# Patient Record
Sex: Female | Born: 1985 | Race: Black or African American | Hispanic: No | Marital: Single | Smoking: Former smoker
Health system: Southern US, Community
[De-identification: ages and names within clinical notes are randomized; demographics above are authoritative.]

## PROBLEM LIST (undated history)

## (undated) DIAGNOSIS — Z789 Other specified health status: Secondary | ICD-10-CM

## (undated) DIAGNOSIS — T7840XA Allergy, unspecified, initial encounter: Secondary | ICD-10-CM

## (undated) DIAGNOSIS — K219 Gastro-esophageal reflux disease without esophagitis: Secondary | ICD-10-CM

## (undated) HISTORY — DX: Gastro-esophageal reflux disease without esophagitis: K21.9

## (undated) HISTORY — PX: NO PAST SURGERIES: SHX2092

## (undated) HISTORY — PX: TUBAL LIGATION: SHX77

## (undated) HISTORY — DX: Allergy, unspecified, initial encounter: T78.40XA

---
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
## 2008-08-31 ENCOUNTER — Emergency Department: Payer: Self-pay | Admitting: Emergency Medicine

## 2009-09-07 ENCOUNTER — Emergency Department: Payer: Self-pay | Admitting: Emergency Medicine

## 2011-01-01 ENCOUNTER — Emergency Department: Payer: Self-pay | Admitting: Emergency Medicine

## 2011-03-08 ENCOUNTER — Emergency Department: Payer: Self-pay | Admitting: Internal Medicine

## 2011-12-26 ENCOUNTER — Emergency Department: Payer: Self-pay | Admitting: Internal Medicine

## 2011-12-26 LAB — URINALYSIS, COMPLETE
Glucose,UR: NEGATIVE mg/dL (ref 0–75)
Leukocyte Esterase: NEGATIVE
Nitrite: NEGATIVE
Protein: NEGATIVE
RBC,UR: 19 /HPF (ref 0–5)
Specific Gravity: 1.01 (ref 1.003–1.030)
Squamous Epithelial: 1

## 2011-12-26 LAB — COMPREHENSIVE METABOLIC PANEL
Albumin: 4 g/dL (ref 3.4–5.0)
Alkaline Phosphatase: 47 U/L — ABNORMAL LOW (ref 50–136)
BUN: 10 mg/dL (ref 7–18)
Bilirubin,Total: 0.4 mg/dL (ref 0.2–1.0)
Co2: 25 mmol/L (ref 21–32)
Creatinine: 0.93 mg/dL (ref 0.60–1.30)
SGOT(AST): 23 U/L (ref 15–37)
SGPT (ALT): 26 U/L
Sodium: 138 mmol/L (ref 136–145)
Total Protein: 8 g/dL (ref 6.4–8.2)

## 2011-12-26 LAB — CBC
MCH: 31.1 pg (ref 26.0–34.0)
MCHC: 33.1 g/dL (ref 32.0–36.0)
MCV: 94 fL (ref 80–100)
Platelet: 228 10*3/uL (ref 150–440)
RBC: 4.04 10*6/uL (ref 3.80–5.20)
RDW: 13.6 % (ref 11.5–14.5)

## 2011-12-26 LAB — HCG, QUANTITATIVE, PREGNANCY: Beta Hcg, Quant.: 8675 m[IU]/mL — ABNORMAL HIGH

## 2011-12-26 LAB — LIPASE, BLOOD: Lipase: 61 U/L — ABNORMAL LOW (ref 73–393)

## 2012-01-02 ENCOUNTER — Emergency Department: Payer: Self-pay | Admitting: Emergency Medicine

## 2012-01-02 LAB — COMPREHENSIVE METABOLIC PANEL
Albumin: 3.9 g/dL (ref 3.4–5.0)
Alkaline Phosphatase: 49 U/L — ABNORMAL LOW (ref 50–136)
Anion Gap: 9 (ref 7–16)
BUN: 11 mg/dL (ref 7–18)
Bilirubin,Total: 0.7 mg/dL (ref 0.2–1.0)
Glucose: 91 mg/dL (ref 65–99)
Potassium: 3.3 mmol/L — ABNORMAL LOW (ref 3.5–5.1)
SGOT(AST): 22 U/L (ref 15–37)
Sodium: 136 mmol/L (ref 136–145)
Total Protein: 7.9 g/dL (ref 6.4–8.2)

## 2012-01-02 LAB — URINALYSIS, COMPLETE
Bacteria: NONE SEEN
Bilirubin,UR: NEGATIVE
Blood: NEGATIVE
Glucose,UR: NEGATIVE mg/dL (ref 0–75)
Leukocyte Esterase: NEGATIVE
Ph: 6 (ref 4.5–8.0)
Specific Gravity: 1.024 (ref 1.003–1.030)
Squamous Epithelial: 3

## 2012-01-02 LAB — CBC
HGB: 13.2 g/dL (ref 12.0–16.0)
MCH: 31.6 pg (ref 26.0–34.0)
MCHC: 33.7 g/dL (ref 32.0–36.0)
Platelet: 238 10*3/uL (ref 150–440)
RBC: 4.18 10*6/uL (ref 3.80–5.20)
RDW: 13.9 % (ref 11.5–14.5)

## 2012-01-02 LAB — HCG, QUANTITATIVE, PREGNANCY: Beta Hcg, Quant.: 25926 m[IU]/mL — ABNORMAL HIGH

## 2012-01-15 ENCOUNTER — Emergency Department: Payer: Self-pay | Admitting: Emergency Medicine

## 2012-01-15 LAB — URINALYSIS, COMPLETE
Glucose,UR: NEGATIVE mg/dL (ref 0–75)
Leukocyte Esterase: NEGATIVE
Protein: 30
RBC,UR: 4 /HPF (ref 0–5)
Specific Gravity: 1.029 (ref 1.003–1.030)
Squamous Epithelial: 7
WBC UR: 2 /HPF (ref 0–5)

## 2012-03-14 ENCOUNTER — Emergency Department: Payer: Self-pay | Admitting: Emergency Medicine

## 2012-03-14 LAB — COMPREHENSIVE METABOLIC PANEL
Albumin: 3.5 g/dL (ref 3.4–5.0)
Alkaline Phosphatase: 45 U/L — ABNORMAL LOW (ref 50–136)
Anion Gap: 8 (ref 7–16)
BUN: 10 mg/dL (ref 7–18)
Bilirubin,Total: 0.3 mg/dL (ref 0.2–1.0)
Creatinine: 0.55 mg/dL — ABNORMAL LOW (ref 0.60–1.30)
EGFR (Non-African Amer.): >60
Potassium: 3.6 mmol/L (ref 3.5–5.1)
SGOT(AST): 28 U/L (ref 15–37)
SGPT (ALT): 19 U/L
Sodium: 137 mmol/L (ref 136–145)
Total Protein: 7.7 g/dL (ref 6.4–8.2)

## 2012-03-14 LAB — CBC
HCT: 34.8 % — ABNORMAL LOW (ref 35.0–47.0)
HGB: 11.6 g/dL — ABNORMAL LOW (ref 12.0–16.0)
MCV: 95 fL (ref 80–100)
Platelet: 254 10*3/uL (ref 150–440)
RDW: 13.3 % (ref 11.5–14.5)
WBC: 8.4 10*3/uL (ref 3.6–11.0)

## 2012-03-14 LAB — URINALYSIS, COMPLETE
Bilirubin,UR: NEGATIVE
Blood: NEGATIVE
Glucose,UR: NEGATIVE mg/dL (ref 0–75)
Ketone: NEGATIVE
Nitrite: NEGATIVE
Ph: 7 (ref 4.5–8.0)
RBC,UR: 1 /HPF (ref 0–5)
Squamous Epithelial: 5
WBC UR: 2 /HPF (ref 0–5)

## 2012-03-14 LAB — LIPASE, BLOOD: Lipase: 65 U/L — ABNORMAL LOW (ref 73–393)

## 2012-03-14 LAB — HCG, QUANTITATIVE, PREGNANCY: Beta Hcg, Quant.: 17583 m[IU]/mL — ABNORMAL HIGH

## 2012-05-12 ENCOUNTER — Observation Stay: Payer: Self-pay

## 2012-05-12 LAB — URINALYSIS, COMPLETE
Bilirubin,UR: NEGATIVE
Ketone: NEGATIVE
Ph: 7 (ref 4.5–8.0)
RBC,UR: 4 /HPF (ref 0–5)
Specific Gravity: 1.014 (ref 1.003–1.030)
Squamous Epithelial: 1
WBC UR: 1 /HPF (ref 0–5)

## 2012-05-14 LAB — URINE CULTURE

## 2012-06-10 ENCOUNTER — Observation Stay: Payer: Self-pay | Admitting: Obstetrics & Gynecology

## 2012-06-10 LAB — URINALYSIS, COMPLETE
Bacteria: NONE SEEN
Bilirubin,UR: NEGATIVE
Blood: NEGATIVE
Leukocyte Esterase: NEGATIVE
Nitrite: NEGATIVE
Ph: 6 (ref 4.5–8.0)
Protein: NEGATIVE
Specific Gravity: 1.013 (ref 1.003–1.030)
Squamous Epithelial: 1

## 2012-07-30 ENCOUNTER — Observation Stay: Payer: Self-pay

## 2012-08-14 ENCOUNTER — Observation Stay: Payer: Self-pay | Admitting: Obstetrics and Gynecology

## 2012-08-19 ENCOUNTER — Observation Stay: Payer: Self-pay | Admitting: Obstetrics & Gynecology

## 2012-08-20 ENCOUNTER — Inpatient Hospital Stay: Payer: Self-pay

## 2012-08-20 LAB — CBC WITH DIFFERENTIAL/PLATELET
Eosinophil #: 0.1 10*3/uL (ref 0.0–0.7)
HCT: 37 % (ref 35.0–47.0)
MCH: 30.7 pg (ref 26.0–34.0)
MCHC: 33.7 g/dL (ref 32.0–36.0)
Monocyte #: 1.5 x10 3/mm — ABNORMAL HIGH (ref 0.2–0.9)
Monocyte %: 14.5 %
Neutrophil %: 67.8 %
Platelet: 232 10*3/uL (ref 150–440)

## 2012-08-21 LAB — HEMATOCRIT: HCT: 34 % — ABNORMAL LOW (ref 35.0–47.0)

## 2013-01-04 IMAGING — CT CT CERVICAL SPINE WITHOUT CONTRAST
1 series · 12 of 14 positions shown, 15 images · non-contrast
Comparison: none

REASON FOR EXAM: pain, multiple trauma
COMMENTS:

PROCEDURE:     CT  - CT CERVICAL SPINE WO  - January 01, 2011 [DATE]
RESULT:     History: Pain.

[Series 6: axial · axial · 0.29mm/px · z∈[+876,+1008]mm · 12 of 79 slices shown, 15 images]
[im 7/79  soft-tissue]
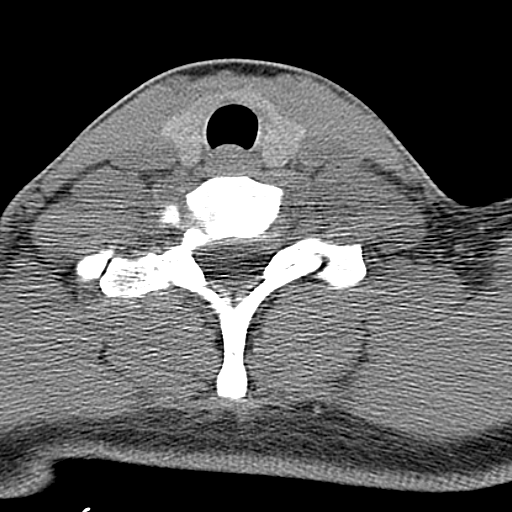
[im 7/79  bone]
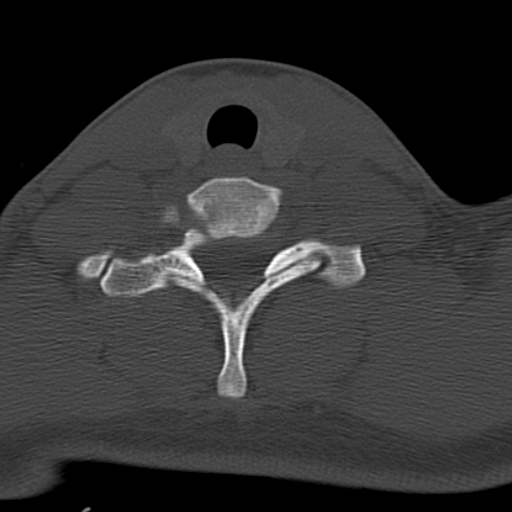
[im 13/79  bone]
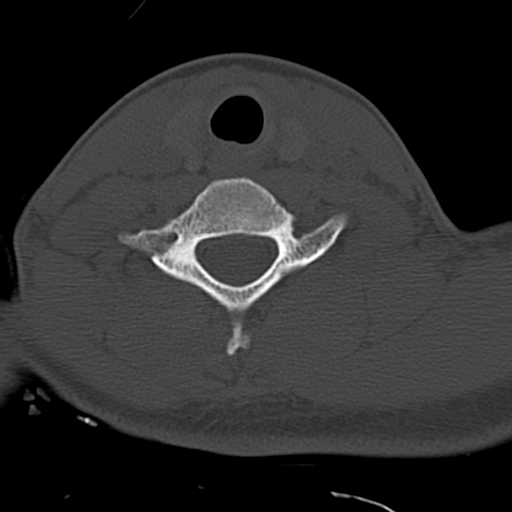
[im 19/79  bone]
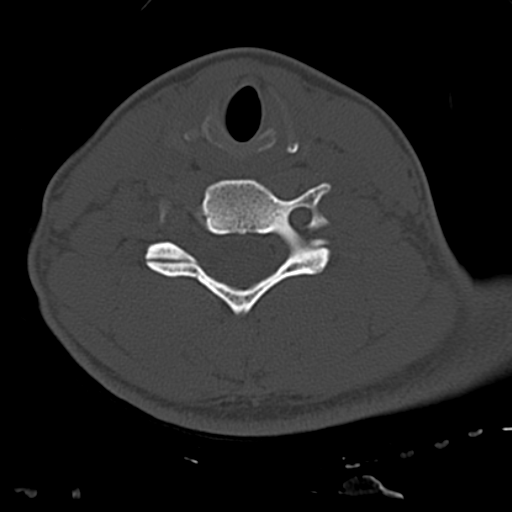
[im 25/79  bone]
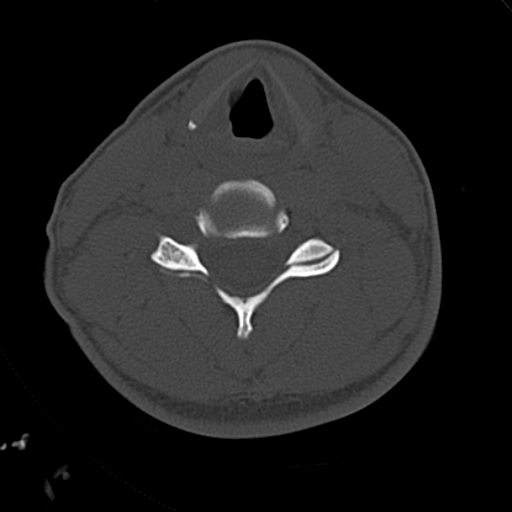
[im 31/79  soft-tissue]
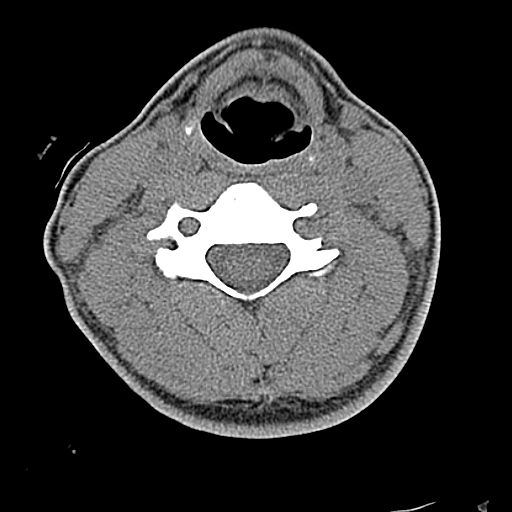
[im 31/79  bone]
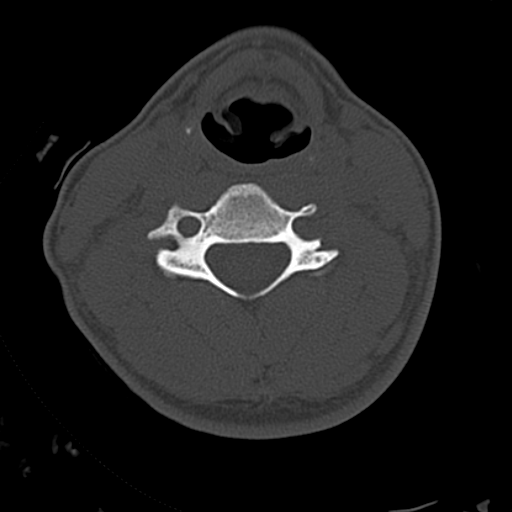
[im 37/79  bone]
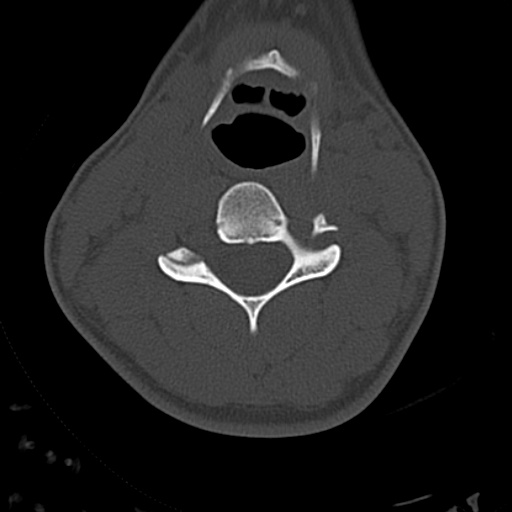
[im 43/79  bone]
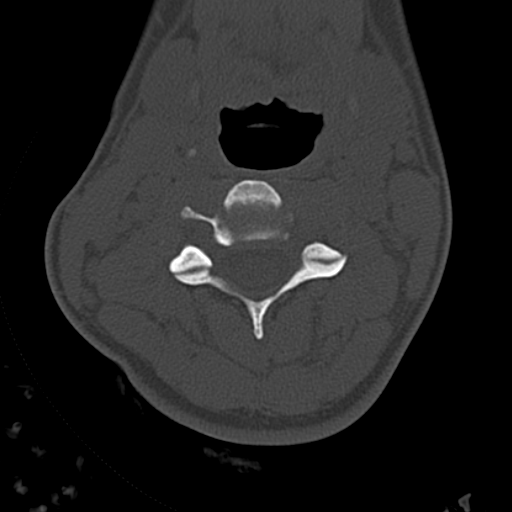
[im 49/79  bone]
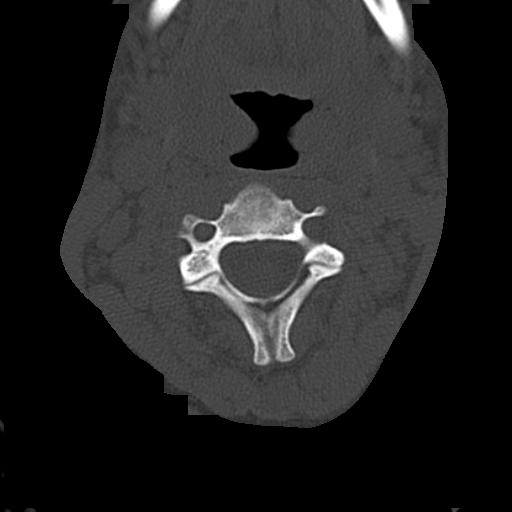
[im 55/79  soft-tissue]
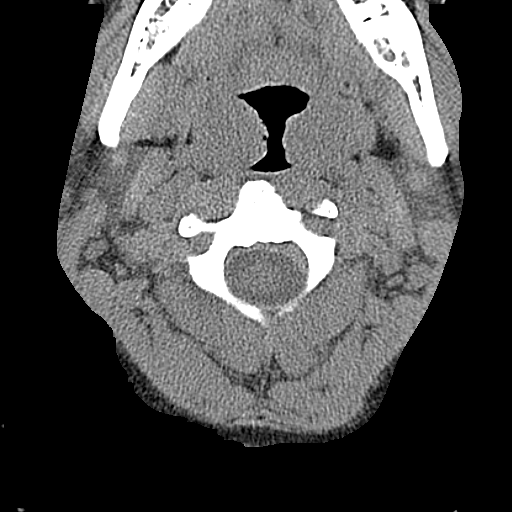
[im 55/79  bone]
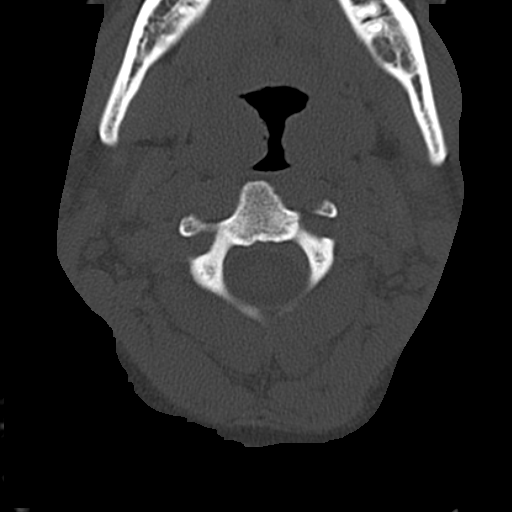
[im 61/79  bone]
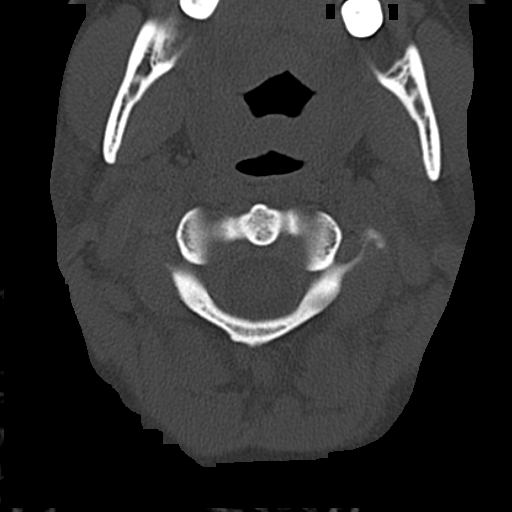
[im 67/79  bone]
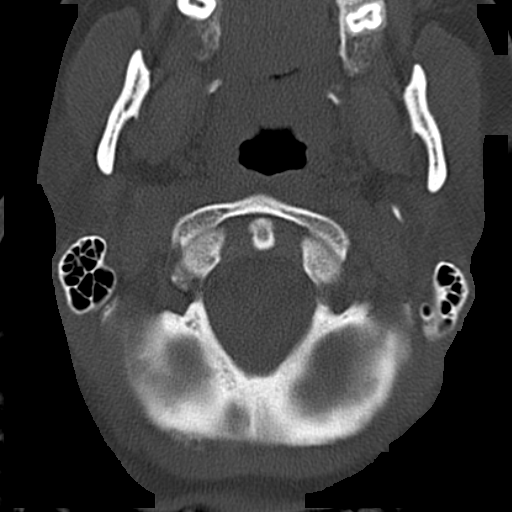
[im 73/79  bone]
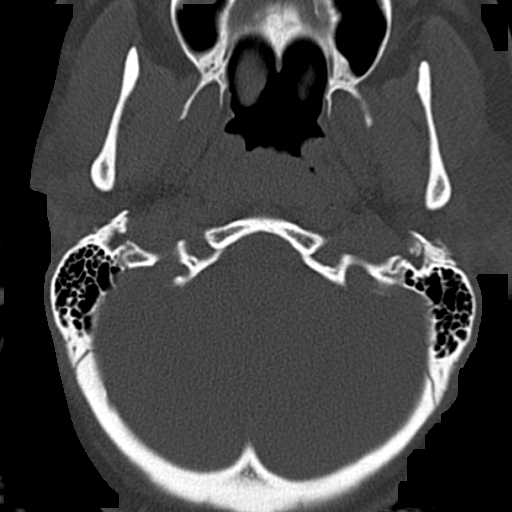

[12 of 14 positions shown; findings below may reference images not displayed]

FINDINGS: Standard CT obtained. No acute bony abnormality. No evidence of
fracture. Multiple cervical lymph nodes are noted. Clinical correlation
suggested.
IMPRESSION: No acute bony abnormality. No is a fracture-dislocation.
2. Multiple cervical lymph nodes were clinical correlation suggested.

## 2013-05-11 ENCOUNTER — Emergency Department: Payer: Self-pay | Admitting: Internal Medicine

## 2013-12-29 IMAGING — US US OB < 14 WEEKS - US OB TV
1 series · 17 of 28 positions shown · non-contrast
Comparison: none

REASON FOR EXAM: vag bl
COMMENTS:

[Series 1: us ob < 14 weeks - us ob tv · 17 of 158 slices shown]
[im 1/158]
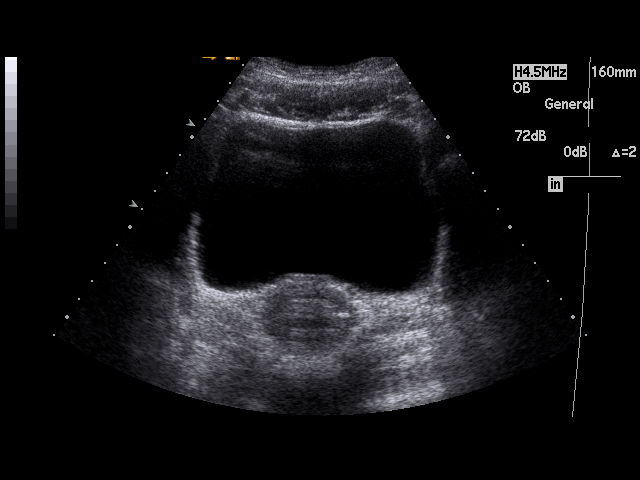
[im 12/158]
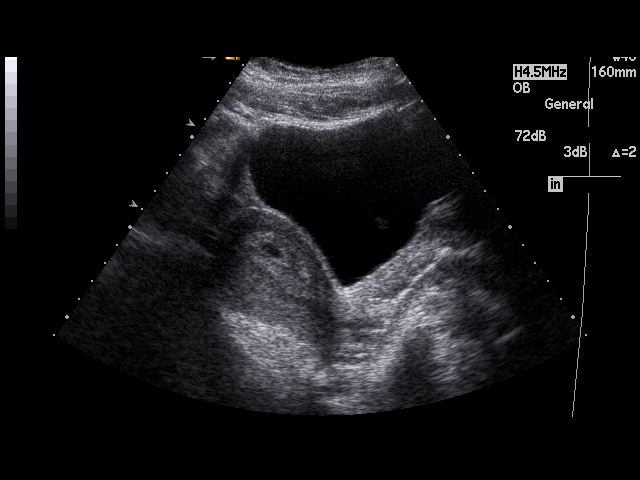
[im 24/158]
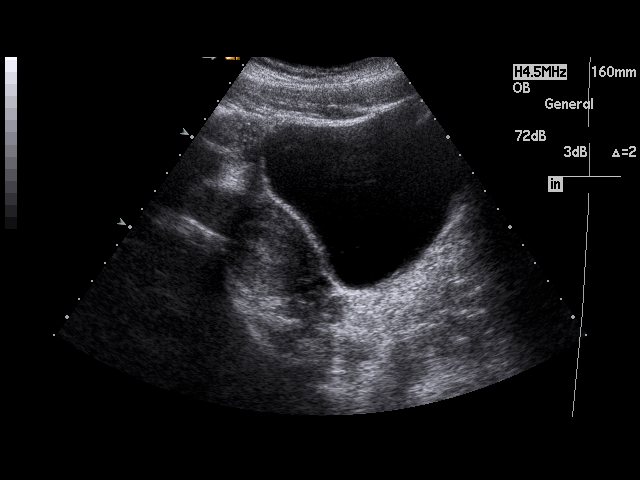
[im 30/158]
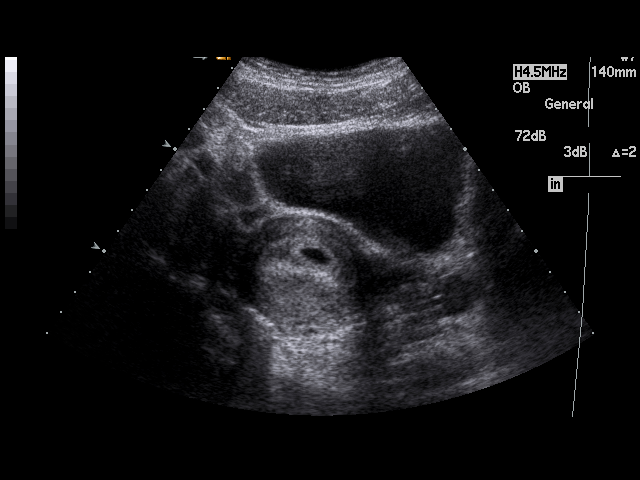
[im 41/158]
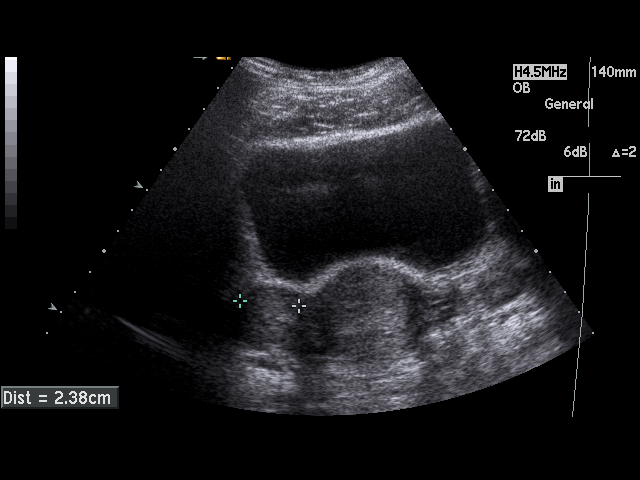
[im 53/158]
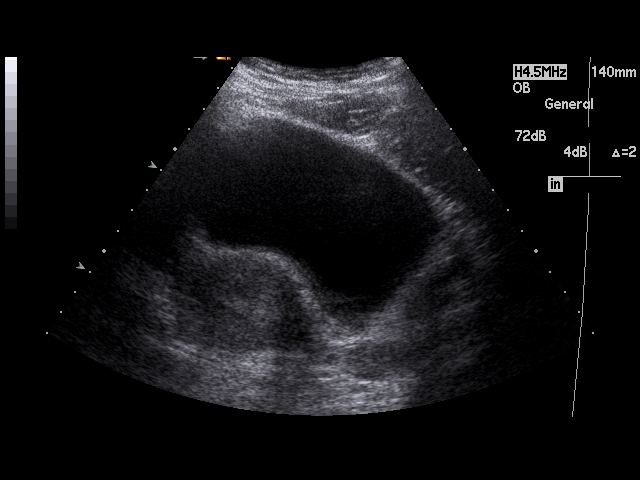
[im 59/158]
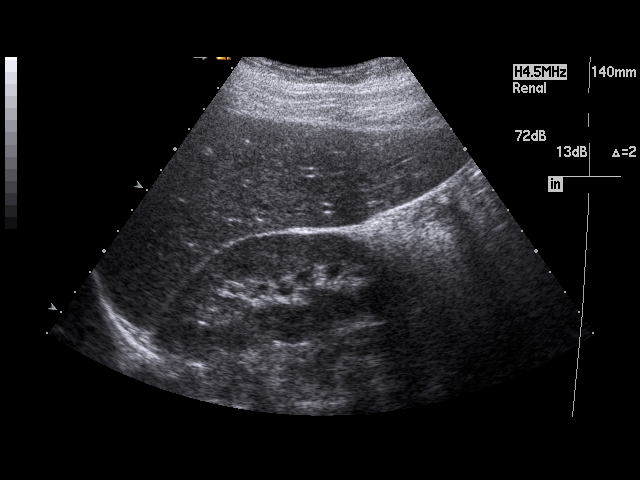
[im 70/158]
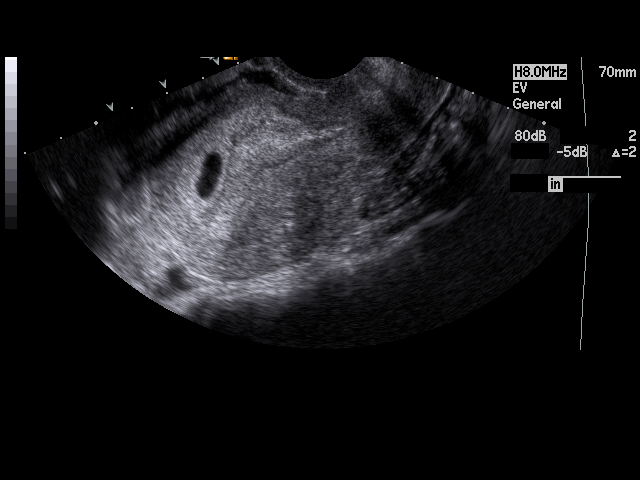
[im 82/158]
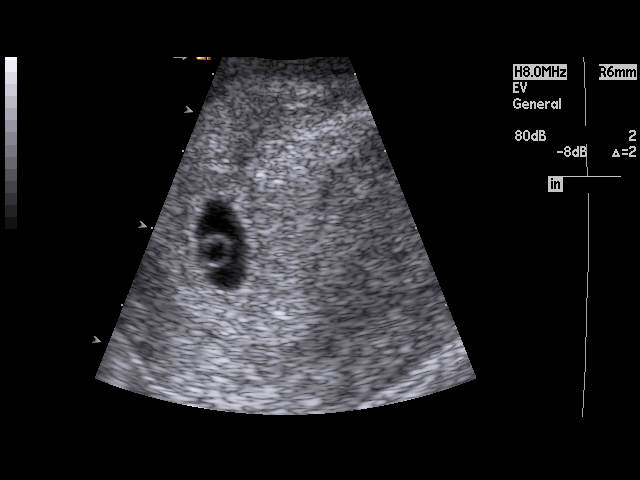
[im 88/158]
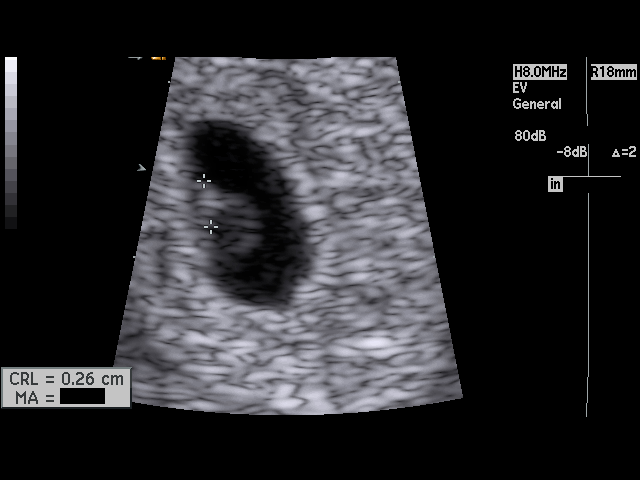
[im 99/158]
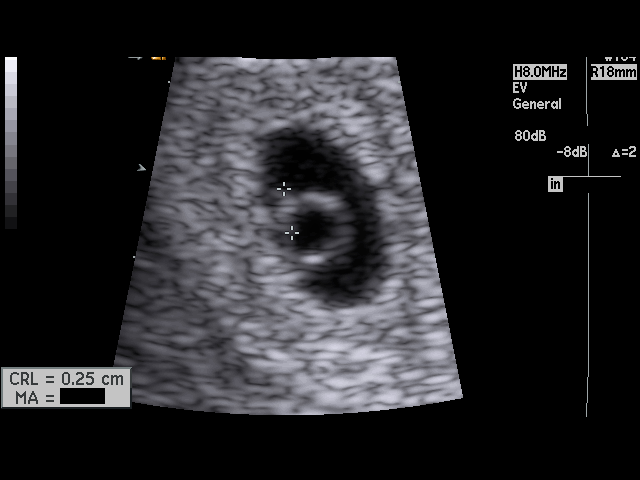
[im 105/158]
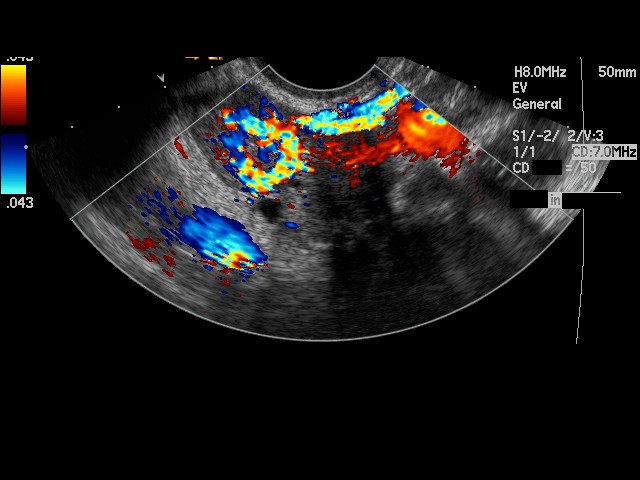
[im 117/158]
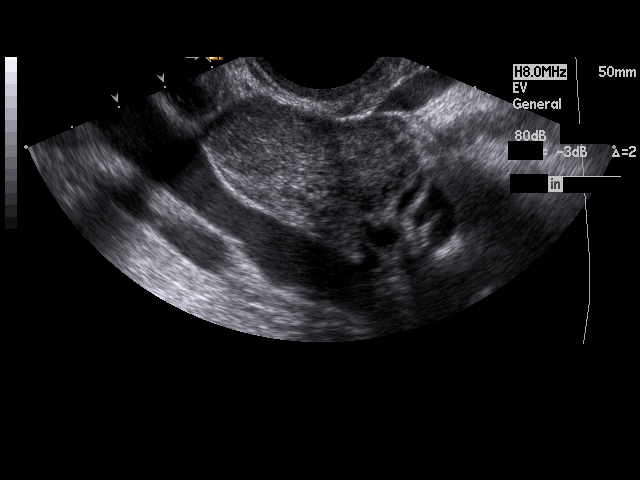
[im 128/158]
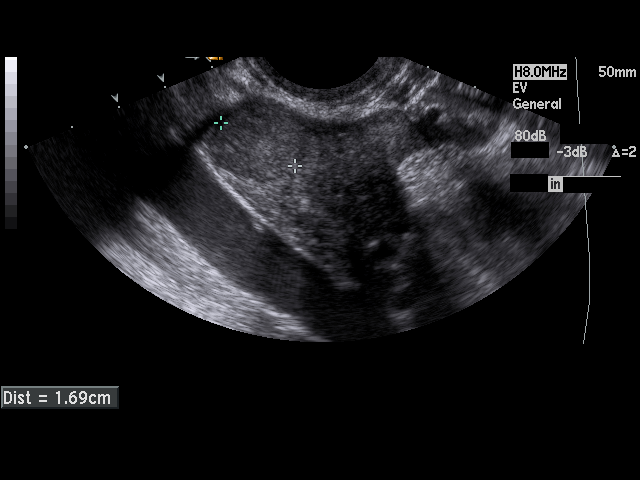
[im 134/158]
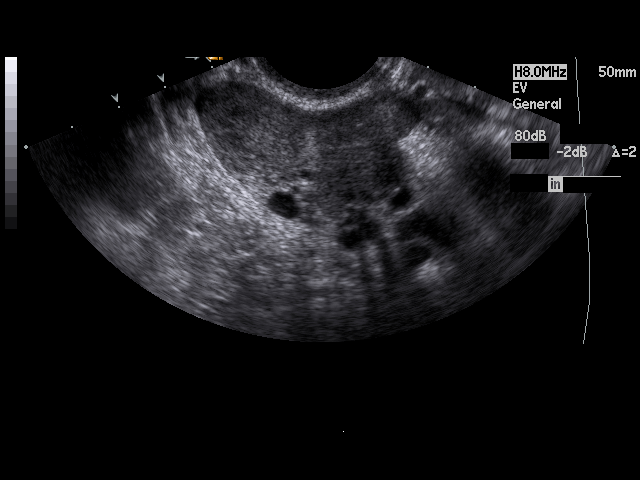
[im 146/158]
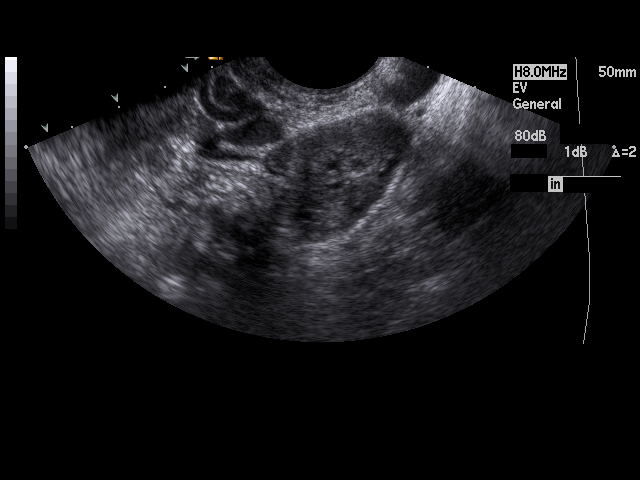
[im 158/158]
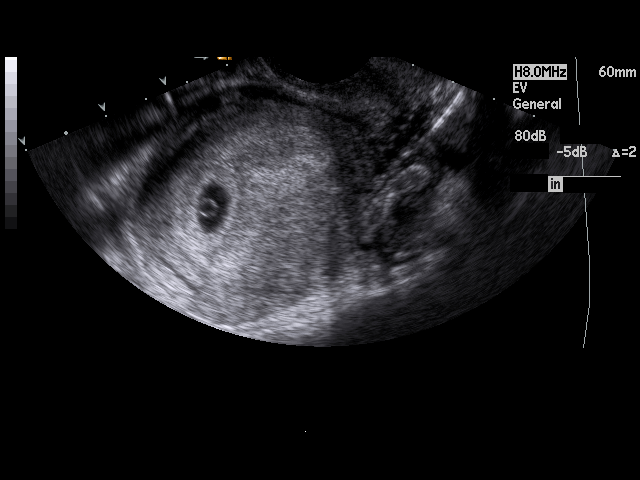

[17 of 28 positions shown; findings below may reference images not displayed]

PROCEDURE:     US  - US OB LESS THAN 14 WEEKS/W TRANS  - December 26, 2011  [DATE]

RESULT:     The uterus contains a gestational sac containing a fetal pole
with crown-rump length of 0.26 cm corresponding to a 5 week 6 day gestation.
A yolk sac is demonstrated as well. A fetal cardiac rate of between 120-133
beats per minute is noted. The maternal right ovary measures 2.6 x 1.9 x
cm. It exhibits heterogeneous echotexture. It also appears mildly
hypervascular. The left ovary measures 2.4 x 1.9 x 1.7 cm and exhibits
developing follicles.
IMPRESSION: 1. There is a gravid uterus with an estimated gestational age of the fetus
of 5 weeks 6 days + / - 3 days. The estimated date of confinement is 21 August, 2012.
2. The right ovary is complex in appearance and somewhat hypervascular. The
appearance is nonspecific however. This will merit followup.

## 2014-05-10 ENCOUNTER — Emergency Department: Payer: Self-pay | Admitting: Emergency Medicine

## 2015-03-22 NOTE — H&P (Signed)
L&D Evaluation:  History:   HPI 29 year old G1P0 presents to L&D at 40 weeks 5 days with c/o contractions. EDD 08/14/12, [facility]. [facility], no significant events during pregnancy while [facility].  GBS Negative Pt set up for IOL in 2 days. Good FM, denies VB, LOF    Presents with contractions    Patient's Medical History No Chronic Illness    Patient's Surgical History none    Medications Pre Natal Vitamins    Allergies NKDA    Social History none    Family History Non-Contributory   Exam:   Vital Signs stable    Urine Protein not completed    General no apparent distress    Estimated Fetal Weight Average for gestational age    Back no CVAT    Edema no edema    Pelvic 2-3 cm per RN on 2 different checks    Mebranes Intact    FHT normal rate with no decels    Ucx irregular, mainly irritability, ctx's about every 15-20 mins   Impression:   Impression reactive NST, contractions 40+ weeks   Plan:   Plan EFM/NST, monitor contractions and for cervical change, discharge, increase PO fluids    Comments will allow pt to go home since no change in cervix and ctx's spaced out to 15-20 mins. Informed pt that appears she is having more irritability and needs to drink more water.  Pt visibally upset about no cervical change and being sent home. Reassurred pt that everything looks good here and ctx's are spaced out, cervix not changing, IOL set up for 2 days from now.  Will give her every opportunity to go into labor on her own. Offered pain meds/sleeping meds but pt declined.   Electronic Signatures: Shella Maximutnam, Courvoisier Hamblen (CNM)  (Signed 08-Oct-13 11:40)  Authored: L&D Evaluation   Last Updated: 08-Oct-13 11:40 by Shella MaximPutnam, Jahdiel Krol (CNM)

## 2015-03-22 NOTE — H&P (Signed)
L&D Evaluation:  History Expanded:   HPI 29 yo G1P0 at [redacted] weeks EGA w suprapubic pain, no new back pain (always has had muscle pain this pregnancy), vb, rom, contractions, DFM, n/v/d/c, f/c. [facility].    Presents with abdominal pain    Patient's Medical History No Chronic Illness    Patient's Surgical History none    Medications Pre Natal Vitamins    Allergies NKDA    Social History none    Family History Non-Contributory   ROS:   ROS All systems were reviewed.  HEENT, CNS, GI, GU, Respiratory, CV, Renal and Musculoskeletal systems were found to be normal.   Exam:   Vital Signs stable    General no apparent distress    Mental Status clear    Chest clear    Heart normal sinus rhythm    Abdomen gravid, non-tender    Estimated Fetal Weight Average for gestational age    Back no CVAT    Edema no edema    FHT normal rate with no decels    Ucx absent    Skin dry   Impression:   Impression Abd Pain.  Eval for UTI.  No other obvious etiology.  No signs or symptoms of Preterm Labor   Plan:   Plan UA, EFM/NST, fluids   Electronic Signatures: Letitia LibraHarris, Jalen Oberry Paul (MD)  (Signed 30-Jul-13 14:57)  Authored: L&D Evaluation   Last Updated: 30-Jul-13 14:57 by Letitia LibraHarris, Sabatino Williard Paul (MD)

## 2015-03-22 NOTE — H&P (Signed)
L&D Evaluation:  History Expanded:   HPI 29 yo G1 whose EDC 1 weeks ago, presents in early labor    Blood Type (Maternal) O positive    Group B Strep Results Maternal (Result >5wks must be treated as unknown) negative    Presents with abdominal pain    Patient's Medical History No Chronic Illness    Patient's Surgical History none    Medications Pre Natal Vitamins    Allergies NKDA    Social History none   Exam:   Vital Signs stable    General no apparent distress    Chest clear    Heart normal sinus rhythm    Abdomen gravid, non-tender    Estimated Fetal Weight Average for gestational age    Pelvic 4 cm, AROM clear fluid    Mebranes Ruptured    Description clear    FHT normal rate with no decels   Impression:   Impression early labor   Electronic Signatures: Towana Badgerosenow, Niti Leisure J (MD)  (Signed 09-Oct-13 10:01)  Authored: L&D Evaluation   Last Updated: 09-Oct-13 10:01 by Towana Badgerosenow, Laureano Hetzer J (MD)

## 2016-12-12 ENCOUNTER — Ambulatory Visit: Payer: Self-pay | Attending: Oncology

## 2016-12-12 ENCOUNTER — Encounter: Payer: Self-pay | Admitting: Obstetrics and Gynecology

## 2016-12-12 ENCOUNTER — Encounter (INDEPENDENT_AMBULATORY_CARE_PROVIDER_SITE_OTHER): Payer: Self-pay

## 2016-12-12 ENCOUNTER — Other Ambulatory Visit: Payer: Self-pay | Admitting: Obstetrics and Gynecology

## 2016-12-12 ENCOUNTER — Inpatient Hospital Stay: Payer: Self-pay | Attending: Obstetrics and Gynecology | Admitting: Obstetrics and Gynecology

## 2016-12-12 ENCOUNTER — Other Ambulatory Visit: Payer: Self-pay | Admitting: *Deleted

## 2016-12-12 VITALS — BP 113/76 | HR 79 | Temp 97.5°F | Ht 70.87 in | Wt 202.2 lb

## 2016-12-12 DIAGNOSIS — R87619 Unspecified abnormal cytological findings in specimens from cervix uteri: Secondary | ICD-10-CM | POA: Insufficient documentation

## 2016-12-12 DIAGNOSIS — B9689 Other specified bacterial agents as the cause of diseases classified elsewhere: Secondary | ICD-10-CM | POA: Insufficient documentation

## 2016-12-12 DIAGNOSIS — F17210 Nicotine dependence, cigarettes, uncomplicated: Secondary | ICD-10-CM | POA: Insufficient documentation

## 2016-12-12 DIAGNOSIS — R1030 Lower abdominal pain, unspecified: Secondary | ICD-10-CM

## 2016-12-12 DIAGNOSIS — N760 Acute vaginitis: Secondary | ICD-10-CM

## 2016-12-12 DIAGNOSIS — N63 Unspecified lump in unspecified breast: Secondary | ICD-10-CM

## 2016-12-12 DIAGNOSIS — E669 Obesity, unspecified: Secondary | ICD-10-CM | POA: Insufficient documentation

## 2016-12-12 LAB — PREGNANCY, URINE: Preg Test, Ur: NEGATIVE

## 2016-12-12 MED ORDER — METRONIDAZOLE 500 MG PO TABS: 500.0000 mg | ORAL_TABLET | Freq: Two times a day (BID) | ORAL | 0 refills | Status: AC

## 2016-12-12 NOTE — Patient Instructions (Signed)
Metronidazole extended-release tablets What is this medicine? METRONIDAZOLE (me troe NI da zole) is an antiinfective. It is used to treat certain kinds of bacterial and protozoal infections. It will not work for colds, flu, or other viral infections. This medicine may be used for other purposes; ask your health care provider or pharmacist if you have questions. COMMON BRAND NAME(S): Flagyl ER What should I tell my health care provider before I take this medicine? They need to know if you have any of these conditions: -anemia or other blood disorders -disease of the nervous system -fungal or yeast infection -if you drink alcohol containing drinks -liver disease -seizures -an unusual or allergic reaction to metronidazole, or other medicines, foods, dyes, or preservatives -pregnant or trying to get pregnant -breast-feeding How should I use this medicine? Take this medicine by mouth with a full glass of water. Follow the directions on the prescription label. Do not crush or chew. Take this medicine on an empty stomach 1 hour before or 2 hours after meals or food. Take your medicine at regular intervals. Do not take your medicine more often than directed. Take all of your medicine as directed even if you think you are better. Do not skip doses or stop your medicine early. Talk to your pediatrician regarding the use of this medicine in children. Special care may be needed. Overdosage: If you think you have taken too much of this medicine contact a poison control center or emergency room at once. NOTE: This medicine is only for you. Do not share this medicine with others. What if I miss a dose? If you miss a dose, take it as soon as you can. If it is almost time for your next dose, take only that dose. Do not take double or extra doses. What may interact with this medicine? Do not take this medicine with any of the following medications: -alcohol or any product that contains alcohol -amprenavir  oral solution -cisapride -disulfiram -dofetilide -dronedarone -paclitaxel injection -pimozide -ritonavir oral solution -sertraline oral solution -sulfamethoxazole-trimethoprim injection -thioridazine -ziprasidone This medicine may also interact with the following medications: -birth control pills -cimetidine -lithium -other medicines that prolong the QT interval (cause an abnormal heart rhythm) -phenobarbital -phenytoin -warfarin This list may not describe all possible interactions. Give your health care provider a list of all the medicines, herbs, non-prescription drugs, or dietary supplements you use. Also tell them if you smoke, drink alcohol, or use illegal drugs. Some items may interact with your medicine. What should I watch for while using this medicine? Tell your doctor or health care professional if your symptoms do not improve or if they get worse. You may get drowsy or dizzy. Do not drive, use machinery, or do anything that needs mental alertness until you know how this medicine affects you. Do not stand or sit up quickly, especially if you are an older patient. This reduces the risk of dizzy or fainting spells. Avoid alcoholic drinks while you are taking this medicine and for three days afterward. Alcohol may make you feel dizzy, sick, or flushed. If you are being treated for a sexually transmitted disease, avoid sexual contact until you have finished your treatment. Your sexual partner may also need treatment. What side effects may I notice from receiving this medicine? Side effects that you should report to your doctor or health care professional as soon as possible: -allergic reactions like skin rash, itching or hives, swelling of the face, lips, or tongue -confusion, clumsiness -difficulty speaking -discolored or sore  mouth -dizziness -fever, infection -numbness, tingling, pain or weakness in the hands or feet -trouble passing urine or change in the amount of  urine -redness, blistering, peeling or loosening of the skin, including inside the mouth -seizures -unusually weak or tired -vaginal irritation, dryness, or discharge Side effects that usually do not require medical attention (report to your doctor or health care professional if they continue or are bothersome): -diarrhea -headache -irritability -metallic taste -nausea -stomach pain or cramps -trouble sleeping This list may not describe all possible side effects. Call your doctor for medical advice about side effects. You may report side effects to FDA at 1-800-FDA-1088. Where should I keep my medicine? Keep out of the reach of children. Store at room temperature between 15 and 30 degrees C (59 and 86 degrees F). Protect from light and moisture. Keep container tightly closed. Throw away any unused medicine after the expiration date. NOTE: This sheet is a summary. It may not cover all possible information. If you have questions about this medicine, talk to your doctor, pharmacist, or health care provider.  2017 Elsevier/Gold Standard (2013-06-05 14:05:10) Bacterial Vaginosis Bacterial vaginosis is an infection of the vagina. It happens when too many germs (bacteria) grow in the vagina. This infection puts you at risk for infections from sex (STIs). Treating this infection can lower your risk for some STIs. You should also treat this if you are pregnant. It can cause your baby to be born early. Follow these instructions at home: Medicines  Take over-the-counter and prescription medicines only as told by your doctor.  Take or use your antibiotic medicine as told by your doctor. Do not stop taking or using it even if you start to feel better. General instructions  If you your sexual partner is a woman, tell her that you have this infection. She needs to get treatment if she has symptoms. If you have a female partner, he does not need to be treated.  During treatment:  Avoid sex.  Do not  douche.  Avoid alcohol as told.  Avoid breastfeeding as told.  Drink enough fluid to keep your pee (urine) clear or pale yellow.  Keep your vagina and butt (rectum) clean.  Wash the area with warm water every day.  Wipe from front to back after you use the toilet.  Keep all follow-up visits as told by your doctor. This is important. Preventing this condition  Do not douche.  Use only warm water to wash around your vagina.  Use protection when you have sex. This includes:  Latex condoms.  Dental dams.  Limit how many people you have sex with. It is best to only have sex with the same person (be monogamous).  Get tested for STIs. Have your partner get tested.  Wear underwear that is cotton or lined with cotton.  Avoid tight pants and pantyhose. This is most important in summer.  Do not use any products that have nicotine or tobacco in them. These include cigarettes and e-cigarettes. If you need help quitting, ask your doctor.  Do not use illegal drugs.  Limit how much alcohol you drink. Contact a doctor if:  Your symptoms do not get better, even after you are treated.  You have more discharge or pain when you pee (urinate).  You have a fever.  You have pain in your belly (abdomen).  You have pain with sex.  Your bleed from your vagina between periods. Summary  This infection happens when too many germs (bacteria) grow in  the vagina.  Treating this condition can lower your risk for some infections from sex (STIs).  You should also treat this if you are pregnant. It can cause early (premature) birth.  Do not stop taking or using your antibiotic medicine even if you start to feel better. This information is not intended to replace advice given to you by your health care provider. Make sure you discuss any questions you have with your health care provider. Document Released: 08/07/2008 Document Revised: 07/14/2016 Document Reviewed: 07/14/2016 Elsevier  Interactive Patient Education  2017 [employer].

## 2016-12-12 NOTE — Progress Notes (Signed)
Subjective:     Patient ID: Christine Mclaughlin, female   DOB: 03/26/1986, 31 y.o.   MRN: [id number]  HPI   Review of Systems     Objective:   Physical Exam  Pulmonary/Chest: Right breast exhibits no inverted nipple, no mass, no nipple discharge, no skin change and no tenderness. Left breast exhibits mass. Left breast exhibits no inverted nipple, no nipple discharge, no skin change and no tenderness. Breasts are asymmetrical.    Left breast greater than right       Assessment:     31 year old referred [facility] for left breast mass x 2 months, and abnormal pap with Atypical Glandular Cells of Unknown Significance. Patient screened, and meets [facility] eligibility.  Patient does not have insurance, Medicare or Medicaid.  Handout given on Affordable Care Act.Instructed patient on breast self-exam using teach back method.  Palpated a firm mobile 2 cm. mass a one o'clock left breast, 5 cm. from areola.     Plan:     Scheduled patient for consult with Dr. Evette CristalSankar on 12/17/16 at 1:00 for left breast mass.  Dr. Sonia SideSecord recommends seeing patient today for colposcopy, and biopsy.

## 2016-12-12 NOTE — Progress Notes (Signed)
[facility]-atypical granular cell of undetermined significance.

## 2016-12-12 NOTE — Progress Notes (Signed)
Gynecologic Oncology Consult Visit   Referring Provider:[facility]  PCP: Christine LernerKarla Wright Leath, NP [facility]  Chief Concern: Atypical glandular cells   Subjective:  Christine Mclaughlin is a 31 y.o. female who [facility] Atypical glandular cells of uncertain significance on Pap.  [facility] and the abnormal Pap. [facility] team has referred her to surgical oncology and she will see them on Monday 12/17/2016 for evaluation and a breast ultrasound.   11/14/2016 Pap revealed AGC underdetermined significance (AGUS)  ROS positive for pelvic cramping x at least one week. [facility].   She also had a WET prep: Clue cells positive; Amine positive; Trich and yeast negative  GC/CL probe: negative per patient  Negative pregnancy test today.   Problem List: Patient Active Problem List   Diagnosis Date Noted  . 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 No pertinent past medical history.  Past Surgical History: History reviewed. No pertinent surgical history.  Past Gynecologic History:  Menarche: 15 Menstrual details: Lasts 7 days Menses regular: yes Last Menstrual Period: 11/07/2017 3 days with light flow; usually every month cycles normal flow  History of Abnormal pap: no, no abnormalities Last pap: as per HPI Contraception: Nexplanon removed 11/20/2016 Sexually active: yes  OB History: G1P1 OB History  No data available    Family History: Family History  Problem Relation Age of Onset  . Diabetes Maternal Grandmother   . Hypertension Maternal Grandmother   . Diabetes Paternal Grandfather   . Hypertension Paternal Grandfather     Social History: Social History   Social History  . Marital status: Single    Spouse name: N/A  . Number of children: N/A  . Years of education: N/A    Occupational History  . Not on file.   Social History Main Topics  . Smoking status: Current Some Day Smoker    Packs/day: 0.50    Years: 10.00  . Smokeless tobacco: Never Used  . Alcohol use No  . Drug use: No  . Sexual activity: No   Other Topics Concern  . Not on file   Social History Narrative  . No narrative on file    Allergies: Allergies  Allergen Reactions  . Orange Fruit [Citrus] Swelling    Tongue lesions    Current Medications: No current outpatient prescriptions on file.   No current facility-administered medications for this visit.     Review of Systems General: negative Skin: negative Eyes: negative for, changes in vision HEENT: negative Breasts: positive for - new or changing breast lumps Pulmonary: negative Cardiac: negative Gastrointestinal: negative Genitourinary/Sexual: negative Ob/Gyn: negative for, irregular bleeding, pain Musculoskeletal: negative Hematology: negative for, easy bruising, bleeding Neurologic/Psych: negative  Objective:  Physical Examination:  There were no vitals taken for this visit.   ECOG Performance Status: 0 - Asymptomatic  General appearance: alert, cooperative and appears stated age HEENT:PERRLA, extra ocular movement intact and sclera clear, anicteric Lymph node survey: non-palpable, axillary, inguinal, supraclavicular Cardiovascular: regular rate and rhythm Respiratory: normal air entry, lungs clear to auscultation Breast exam: not examined; seen by [facility] team Abdomen: soft, nondistended, tenderness mild in the lower abdomen, without guarding, without rebound and no masses palpated Back: inspection of back is normal Extremities: extremities normal, atraumatic, no cyanosis or edema Neurological exam reveals alert, oriented, normal speech, no focal findings or movement  disorder noted.  Pelvic: exam chaperoned by nurse;  Vulva: normal appearing vulva with no masses, tenderness or lesions; Vagina: vagina  positive for whitish moderate discharge; Adnexa: normal adnexa in size, nontender and no masses; Uterus: uterus is normal size, shape, consistency with mild to moderate tenderness along the lateral aspects of the uterus on both sides; Cervix: no lesions no CMT, specimen obtained for HPV testing; Rectal: not indicated  The risks and benefits of the procedure were reviewed and informed consent obtained. Time out was performed. The patient received pre-procedure teaching and expressed understanding. The post-procedure instructions were reviewed with the patient and she expressed understanding. The patient does not have any barriers to learning.  Colposcopy of cervix and upper vagina performed with acetic acid. WE abnormality at 1 oclock involving what appears to be a 5 mm cervical polyp. Biopsy performed at that site after application of Hurricane gel and preparation with Betadine. Hemostasis with AgNO3 and Monsel's solution. ECC obtained.   Post-procedure evaluation the patient was stable without complaints.   Lab Review Labs on site today: bHCG negative  Radiologic Imaging: pending    Assessment:  Christine Mclaughlin is a 31 y.o. female diagnosed with Atypical glandular cells of uncertain significance on Pap. 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   Pelvic pain of uncertain etiology. Negative GC/CL (we have requested results from outside practice) make PID less likely.   Bacterial Vaginosis, symptomatic.   Medical co-morbidities complicating care: obesity.  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   Pelvic ultrasound ordered to assess anatomy and etiology of pelvic pain.   Bacterial vaginosis. We discussed treatment and she is  interested. I have recommended a course of oral Flagyl.  I provided information regarding BV and this medication in her AVS.   The patient's diagnosis, an outline of the further diagnostic and laboratory studies which will be required, the recommendation, and alternatives were discussed.  All questions were answered to the patient's satisfaction.  A total of 60 minutes were spent with the patient/family today; 60% was spent in education, counseling and coordination of care for abnormal Pap, pelvic pain, and bacterial vaginosis.Marland Kitchen    Artelia Laroche, MD  ADDENDUM: GC/CL results verified and are negative.    CC:   PCP: Christine Lerner, NP [address] [place], [place] [address] [phone]

## 2016-12-12 NOTE — Progress Notes (Signed)
  Oncology Nurse Navigator Documentation Chaperoned pelvic exam. After Hpv, ECC, and cervical biopsy collected, they were sent to pathology. Navigator Location: [facility] (12/12/16 1400)   )Navigator Encounter Type: Initial GynOnc (12/12/16 1400)                     Patient Visit Type: GynOnc (12/12/16 1400)                              Time Spent with Patient: 45 (12/12/16 1400)

## 2016-12-14 ENCOUNTER — Encounter: Payer: Self-pay | Admitting: *Deleted

## 2016-12-14 LAB — SURGICAL PATHOLOGY

## 2016-12-17 ENCOUNTER — Encounter: Payer: Self-pay | Admitting: General Surgery

## 2016-12-17 ENCOUNTER — Ambulatory Visit (INDEPENDENT_AMBULATORY_CARE_PROVIDER_SITE_OTHER): Payer: PRIVATE HEALTH INSURANCE | Admitting: General Surgery

## 2016-12-17 ENCOUNTER — Inpatient Hospital Stay: Payer: Self-pay

## 2016-12-17 ENCOUNTER — Telehealth: Payer: Self-pay | Admitting: *Deleted

## 2016-12-17 VITALS — BP 126/74 | HR 74 | Resp 14 | Ht 70.0 in | Wt 200.0 lb

## 2016-12-17 DIAGNOSIS — N6320 Unspecified lump in the left breast, unspecified quadrant: Secondary | ICD-10-CM | POA: Diagnosis not present

## 2016-12-17 NOTE — Progress Notes (Signed)
Patient ID: Christine Mclaughlin, female   DOB: 01/16/1986, 31 y.o.   MRN: [id number]  Chief Complaint  Patient presents with  . Other    Breast mass    HPI Christine Mclaughlin is a 31 y.o. female is here today for an evaluation of left breast lump. She noticed this about two months ago. Has got bigger over the last couple of weeks. Painful with touch.  Her mother is present at visit. No family history of breast cancer. She also had col[poscopy and biopsy yesterday and is due to have a pelvic US tomorrow. Last period was 1 mo ago- her periods are regular I have reviewed the history of present illness with the patient.  HPI  No past medical history on file.  No past surgical history on file.  Family History  Problem Relation Age of Onset  . Diabetes Maternal Grandmother   . Hypertension Maternal Grandmother   . Diabetes Paternal Grandfather   . Hypertension Paternal Grandfather     Social History Social History  Substance Use Topics  . Smoking status: Current Some Day Smoker    Packs/day: 0.50    Years: 10.00  . Smokeless tobacco: Never Used  . Alcohol use No    Allergies  Allergen Reactions  . Orange Fruit [Citrus] Swelling    Tongue lesions    Current Outpatient Prescriptions  Medication Sig Dispense Refill  . metroNIDAZOLE (FLAGYL) 500 MG tablet Take 1 tablet (500 mg total) by mouth 2 (two) times daily. 14 tablet 0   No current facility-administered medications for this visit.     Review of Systems Review of Systems  Constitutional: Negative.   Respiratory: Negative.   Cardiovascular: Negative.     Blood pressure 126/74, pulse 74, resp. rate 14, height 5\' 10"  (1.778 m), weight 200 lb (90.7 kg), last menstrual period 11/21/2016.  Physical Exam Physical Exam  Constitutional: She is oriented to person, place, and time. She appears well-developed and well-nourished.  Eyes: Conjunctivae are normal. No scleral icterus.  Neck: Neck supple.  Cardiovascular: Normal rate,  regular rhythm and normal heart sounds.   Pulmonary/Chest: Effort normal and breath sounds normal. Right breast exhibits no inverted nipple, no mass, no nipple discharge, no skin change and no tenderness. Left breast exhibits mass and tenderness. Left breast exhibits no inverted nipple, no nipple discharge and no skin change.       Abdominal: Soft. Bowel sounds are normal. There is no tenderness.  Lymphadenopathy:    She has no cervical adenopathy.    She has no axillary adenopathy.  Neurological: She is alert and oriented to person, place, and time.  Skin: Skin is warm and dry.    Data Reviewed Notes reviewed   Assessment   Targeted US of left breast mass reveals a 1cm area of shadowing. No defined mass seen. Atypical ridge of tissue located near axillary tail in left breast. Possible fibrosis. No overt sign of malignancy.     Plan    Follow-up when patient is mid-cycle in 2-3 weeks. If mass is is unchanged will perform core needle biopsy.  Instructed patient that it is okay to take OTC acetaminophen or ibuprofen as needed for pain.     This information has been scribed by Ples SpecterJessica Qualls CMA.   Goebel Hellums G 12/17/2016, 1:30 PM

## 2016-12-17 NOTE — Telephone Encounter (Signed)
Asking for a return call regarding the PAP smear they received. Please return call [phone]

## 2016-12-17 NOTE — Telephone Encounter (Signed)
Spoke with Selena BattenKim at [facility]. Question answered regarding specimen sent.

## 2016-12-17 NOTE — Telephone Encounter (Signed)
Direct office number left on voicemail at [facility]. Will continue to try and contact regarding their need.

## 2016-12-18 ENCOUNTER — Ambulatory Visit
Admission: RE | Admit: 2016-12-18 | Discharge: 2016-12-18 | Disposition: A | Discharge status: Home / Self Care | Payer: Self-pay | Source: Ambulatory Visit | Attending: Obstetrics and Gynecology | Admitting: Obstetrics and Gynecology

## 2016-12-18 DIAGNOSIS — R1030 Lower abdominal pain, unspecified: Secondary | ICD-10-CM

## 2016-12-20 NOTE — Progress Notes (Addendum)
Patient notified of benign pathology results, and normal pelvic ultrasound results.  Scheduled to return on 12/18/17 at 3:00 for follow-up pap per [facility] guide;lines.  Mailed appointment info.  She is to follow-up with Dr. Evette CristalSankar on 01/08/17.  Per his notes, possible left  breast biopsy if mass unchanged from 12/17/16 ultrasound done in his office.

## 2016-12-21 LAB — HPV DNA PROBE HIGH RISK, AMPLIFIED: HPV, HIGH-RISK: NEGATIVE

## 2016-12-24 LAB — GYN REPORT: PAP Smear Comment: 0

## 2016-12-24 LAB — SPECIMEN STATUS REPORT

## 2017-01-08 ENCOUNTER — Ambulatory Visit: Payer: PRIVATE HEALTH INSURANCE | Admitting: General Surgery

## 2017-01-14 ENCOUNTER — Ambulatory Visit: Payer: PRIVATE HEALTH INSURANCE | Admitting: General Surgery

## 2017-01-24 ENCOUNTER — Ambulatory Visit (INDEPENDENT_AMBULATORY_CARE_PROVIDER_SITE_OTHER): Payer: PRIVATE HEALTH INSURANCE | Admitting: General Surgery

## 2017-01-24 ENCOUNTER — Encounter: Payer: Self-pay | Admitting: General Surgery

## 2017-01-24 VITALS — BP 140/78 | HR 74 | Resp 13 | Ht 70.0 in

## 2017-01-24 DIAGNOSIS — N6320 Unspecified lump in the left breast, unspecified quadrant: Secondary | ICD-10-CM | POA: Diagnosis not present

## 2017-01-24 NOTE — Progress Notes (Signed)
Patient ID: Christine Mclaughlin, female   DOB: 07/30/1986, 31 y.o.   MRN: [id number]  Chief Complaint  Patient presents with  . Follow-up    HPI Christine Mclaughlin is a 31 y.o. female here today for her three week follow up of left breast mass. Patient states the area is less tender since her last visit. I have reviewed the history of present illness with the patient.   HPI  No past medical history on file.  No past surgical history on file.  Family History  Problem Relation Age of Onset  . Diabetes Maternal Grandmother   . Hypertension Maternal Grandmother   . Diabetes Paternal Grandfather   . Hypertension Paternal Grandfather     Social History Social History  Substance Use Topics  . Smoking status: Current Some Day Smoker    Packs/day: 0.50    Years: 10.00  . Smokeless tobacco: Never Used  . Alcohol use No    Allergies  Allergen Reactions  . Orange Fruit [Citrus] Swelling    Tongue lesions    No current outpatient prescriptions on file.   No current facility-administered medications for this visit.     Review of Systems Review of Systems  Constitutional: Negative.   Respiratory: Negative.   Cardiovascular: Negative.     Blood pressure 140/78, pulse 74, resp. rate 13, height 5\' 10"  (1.778 m), last menstrual period 01/20/2017.  Physical Exam Physical Exam  Constitutional: She is oriented to person, place, and time. She appears well-developed and well-nourished.  Eyes: Conjunctivae are normal. No scleral icterus.  Pulmonary/Chest: Right breast exhibits no inverted nipple, no mass, no nipple discharge, no skin change and no tenderness. Left breast exhibits no inverted nipple, no mass, no nipple discharge, no skin change and no tenderness.  Lymphadenopathy:    She has no cervical adenopathy.    She has no axillary adenopathy.  Neurological: She is alert and oriented to person, place, and time.  Skin: Skin is warm and dry.    Data Reviewed Prior notes  reviewed  Assessment    Previously noted ridge of tissue in the left axillary tail appears resolved.     Plan    Patient to return in 6 weeks.    This information has been scribed by Ples SpecterJessica Qualls CMA.     SANKAR,SEEPLAPUTHUR G 01/24/2017, 3:50 PM

## 2017-01-24 NOTE — Patient Instructions (Addendum)
Patient to return in 6 weeks  

## 2017-01-30 NOTE — Progress Notes (Signed)
Per Dr. Evette CristalSankar, area of concern in left breast has resolved.  Copy to [facility].

## 2017-03-07 ENCOUNTER — Ambulatory Visit: Payer: PRIVATE HEALTH INSURANCE | Admitting: General Surgery

## 2017-04-10 ENCOUNTER — Encounter: Payer: Self-pay | Admitting: *Deleted

## 2017-12-18 ENCOUNTER — Ambulatory Visit: Payer: Self-pay | Attending: Oncology

## 2023-03-20 ENCOUNTER — Ambulatory Visit (INDEPENDENT_AMBULATORY_CARE_PROVIDER_SITE_OTHER): Payer: Self-pay | Admitting: Family Medicine

## 2023-03-20 DIAGNOSIS — Z91199 Patient's noncompliance with other medical treatment and regimen due to unspecified reason: Secondary | ICD-10-CM

## 2023-03-20 NOTE — Progress Notes (Signed)
    No PE was performed as pt was not seen today. No call/no show/late arrival.    Jacky Kindle, FNP  [facility], [facility] [phone] (phone) [phone] (fax)  [facility]

## 2023-12-26 ENCOUNTER — Other Ambulatory Visit: Payer: Self-pay | Admitting: Obstetrics

## 2024-01-08 ENCOUNTER — Encounter (HOSPITAL_COMMUNITY): Payer: Self-pay | Admitting: Obstetrics

## 2024-01-08 NOTE — Progress Notes (Signed)
 Spoke w/ via phone for pre-op interview--- [place] [facility] needs dos----    UPT per Careers adviser.     Lab results------ COVID test -----patient states asymptomatic no test needed Arrive at -------1100 NPO after MN NO Solid Food.  Clear liquids from MN until---1000 Pre-Surgery Ensure or G2:  Med rec completed Medications to take morning of surgery -----NONE Diabetic medication -----  GLP1 agonist last dose: GLP1 instructions:  Patient instructed no nail polish to be worn day of surgery Patient instructed to bring photo id and insurance card day of surgery Patient aware to have Driver (ride ) / caregiver    for 24 hours after surgery - Boyfriend Everlean Alstrom Patient Special Instructions -----Shower with antibacterial soap. Pre-Op special Instructions -----  Patient verbalized understanding of instructions that were given at this phone interview. Patient denies chest pain, sob, fever, cough at the interview.

## 2024-01-21 ENCOUNTER — Encounter (HOSPITAL_COMMUNITY): Payer: Self-pay | Admitting: Obstetrics

## 2024-01-21 ENCOUNTER — Encounter (HOSPITAL_COMMUNITY)
Admission: RE | Disposition: A | Discharge status: Home / Self Care | Payer: Self-pay | Source: Home / Self Care | Attending: Obstetrics

## 2024-01-21 ENCOUNTER — Other Ambulatory Visit: Payer: Self-pay

## 2024-01-21 ENCOUNTER — Ambulatory Visit (HOSPITAL_BASED_OUTPATIENT_CLINIC_OR_DEPARTMENT_OTHER): Admitting: Anesthesiology

## 2024-01-21 ENCOUNTER — Ambulatory Visit (HOSPITAL_COMMUNITY): Admitting: Anesthesiology

## 2024-01-21 ENCOUNTER — Ambulatory Visit (HOSPITAL_COMMUNITY)
Admission: RE | Admit: 2024-01-21 | Discharge: 2024-01-21 | Disposition: A | Discharge status: Home / Self Care | Payer: Self-pay | Attending: Obstetrics | Admitting: Obstetrics

## 2024-01-21 DIAGNOSIS — Z302 Encounter for sterilization: Secondary | ICD-10-CM

## 2024-01-21 DIAGNOSIS — Z87891 Personal history of nicotine dependence: Secondary | ICD-10-CM | POA: Insufficient documentation

## 2024-01-21 HISTORY — DX: Other specified health status: Z78.9

## 2024-01-21 HISTORY — PX: LAPAROSCOPIC TUBAL LIGATION: SHX1937

## 2024-01-21 LAB — POCT PREGNANCY, URINE: Preg Test, Ur: NEGATIVE

## 2024-01-21 SURGERY — LIGATION, FALLOPIAN TUBE, LAPAROSCOPIC
Anesthesia: General | Laterality: Bilateral

## 2024-01-21 MED ORDER — ONDANSETRON HCL 4 MG/2ML IJ SOLN
INTRAMUSCULAR | Status: AC
  Filled 2024-01-21: qty 2

## 2024-01-21 MED ORDER — AMISULPRIDE (ANTIEMETIC) 5 MG/2ML IV SOLN
10.0000 mg | Freq: Once | INTRAVENOUS | Status: AC | PRN
  Administered 2024-01-21: 10 mg via INTRAVENOUS

## 2024-01-21 MED ORDER — ONDANSETRON HCL 4 MG/2ML IJ SOLN
INTRAMUSCULAR | Status: DC | PRN
  Administered 2024-01-21: 4 mg via INTRAVENOUS

## 2024-01-21 MED ORDER — BUPIVACAINE HCL (PF) 0.25 % IJ SOLN
INTRAMUSCULAR | Status: AC
  Filled 2024-01-21: qty 30

## 2024-01-21 MED ORDER — OXYCODONE HCL 5 MG PO TABS
ORAL_TABLET | ORAL | Status: AC
  Filled 2024-01-21: qty 1

## 2024-01-21 MED ORDER — OXYCODONE HCL 5 MG/5ML PO SOLN: 5.0000 mg | Freq: Once | ORAL | Status: DC | PRN

## 2024-01-21 MED ORDER — ONDANSETRON HCL 4 MG PO TABS
4.0000 mg | ORAL_TABLET | Freq: Once | ORAL | Status: AC
  Administered 2024-01-21: 4 mg via ORAL
  Filled 2024-01-21: qty 1

## 2024-01-21 MED ORDER — PROPOFOL 10 MG/ML IV BOLUS
INTRAVENOUS | Status: DC | PRN
  Administered 2024-01-21: 160 mg via INTRAVENOUS
  Administered 2024-01-21: 40 mg via INTRAVENOUS

## 2024-01-21 MED ORDER — LIDOCAINE 2% (20 MG/ML) 5 ML SYRINGE
INTRAMUSCULAR | Status: DC | PRN
Start: 2024-01-21 — End: 2024-01-21
  Administered 2024-01-21: 60 mg via INTRAVENOUS

## 2024-01-21 MED ORDER — OXYCODONE HCL 5 MG PO TABS: 5.0000 mg | ORAL_TABLET | ORAL | Status: DC | PRN

## 2024-01-21 MED ORDER — IBUPROFEN 200 MG PO TABS: 600.0000 mg | ORAL_TABLET | Freq: Four times a day (QID) | ORAL | Status: AC

## 2024-01-21 MED ORDER — HYDROMORPHONE HCL 1 MG/ML IJ SOLN
INTRAMUSCULAR | Status: DC | PRN
  Administered 2024-01-21 (×2): .5 mg via INTRAVENOUS

## 2024-01-21 MED ORDER — POVIDONE-IODINE 10 % EX SWAB: 2.0000 | Freq: Once | CUTANEOUS | Status: DC

## 2024-01-21 MED ORDER — ROCURONIUM BROMIDE 10 MG/ML (PF) SYRINGE
PREFILLED_SYRINGE | INTRAVENOUS | Status: DC | PRN
Start: 2024-01-21 — End: 2024-01-21
  Administered 2024-01-21: 50 mg via INTRAVENOUS

## 2024-01-21 MED ORDER — CHLORHEXIDINE GLUCONATE 0.12 % MT SOLN
15.0000 mL | Freq: Once | OROMUCOSAL | Status: AC
  Administered 2024-01-21: 15 mL via OROMUCOSAL

## 2024-01-21 MED ORDER — STERILE WATER FOR IRRIGATION IR SOLN
Status: DC | PRN
  Administered 2024-01-21: 1000 mL

## 2024-01-21 MED ORDER — OXYCODONE HCL 5 MG PO TABS: 5.0000 mg | ORAL_TABLET | Freq: Once | ORAL | Status: DC | PRN

## 2024-01-21 MED ORDER — CELECOXIB 200 MG PO CAPS
ORAL_CAPSULE | ORAL | Status: AC
  Filled 2024-01-21: qty 1

## 2024-01-21 MED ORDER — HYDROMORPHONE HCL 1 MG/ML IJ SOLN
INTRAMUSCULAR | Status: AC
  Filled 2024-01-21: qty 1

## 2024-01-21 MED ORDER — DEXAMETHASONE SODIUM PHOSPHATE 10 MG/ML IJ SOLN
INTRAMUSCULAR | Status: DC | PRN
Start: 2024-01-21 — End: 2024-01-21
  Administered 2024-01-21: 8 mg via INTRAVENOUS

## 2024-01-21 MED ORDER — ACETAMINOPHEN 500 MG PO TABS
1000.0000 mg | ORAL_TABLET | Freq: Once | ORAL | Status: AC
  Administered 2024-01-21: 1000 mg via ORAL

## 2024-01-21 MED ORDER — CHLORHEXIDINE GLUCONATE 0.12 % MT SOLN
OROMUCOSAL | Status: DC
Start: 2024-01-21 — End: 2024-01-22
  Filled 2024-01-21: qty 15

## 2024-01-21 MED ORDER — MIDAZOLAM HCL 2 MG/2ML IJ SOLN
INTRAMUSCULAR | Status: AC
  Filled 2024-01-21: qty 2

## 2024-01-21 MED ORDER — AMISULPRIDE (ANTIEMETIC) 5 MG/2ML IV SOLN
INTRAVENOUS | Status: AC
  Filled 2024-01-21: qty 4

## 2024-01-21 MED ORDER — FENTANYL CITRATE (PF) 250 MCG/5ML IJ SOLN
INTRAMUSCULAR | Status: AC
Start: 2024-01-21 — End: ?
  Filled 2024-01-21: qty 5

## 2024-01-21 MED ORDER — ACETAMINOPHEN 500 MG PO TABS
1000.0000 mg | ORAL_TABLET | Freq: Four times a day (QID) | ORAL | Status: AC
Start: 2024-01-21 — End: 2024-01-28

## 2024-01-21 MED ORDER — BUPIVACAINE HCL (PF) 0.25 % IJ SOLN
INTRAMUSCULAR | Status: DC | PRN
  Administered 2024-01-21: 18 mL

## 2024-01-21 MED ORDER — LACTATED RINGERS IV SOLN: INTRAVENOUS | Status: DC

## 2024-01-21 MED ORDER — MIDAZOLAM HCL 2 MG/2ML IJ SOLN
INTRAMUSCULAR | Status: DC | PRN
  Administered 2024-01-21: 2 mg via INTRAVENOUS

## 2024-01-21 MED ORDER — SUGAMMADEX SODIUM 200 MG/2ML IV SOLN
INTRAVENOUS | Status: DC | PRN
Start: 2024-01-21 — End: 2024-01-21
  Administered 2024-01-21: 200 mg via INTRAVENOUS

## 2024-01-21 MED ORDER — ACETAMINOPHEN 500 MG PO TABS
ORAL_TABLET | ORAL | Status: AC
  Filled 2024-01-21: qty 2

## 2024-01-21 MED ORDER — CELECOXIB 200 MG PO CAPS
200.0000 mg | ORAL_CAPSULE | Freq: Once | ORAL | Status: AC
  Administered 2024-01-21: 200 mg via ORAL

## 2024-01-21 MED ORDER — FENTANYL CITRATE (PF) 250 MCG/5ML IJ SOLN
INTRAMUSCULAR | Status: DC | PRN
  Administered 2024-01-21 (×2): 100 ug via INTRAVENOUS
  Administered 2024-01-21: 50 ug via INTRAVENOUS

## 2024-01-21 MED ORDER — HYDROMORPHONE HCL 1 MG/ML IJ SOLN
0.2500 mg | INTRAMUSCULAR | Status: DC | PRN
  Administered 2024-01-21 (×4): 0.5 mg via INTRAVENOUS

## 2024-01-21 MED ORDER — ORAL CARE MOUTH RINSE: 15.0000 mL | Freq: Once | OROMUCOSAL | Status: AC

## 2024-01-21 MED ORDER — POLYETHYLENE GLYCOL 3350 17 G PO PACK: 17.0000 g | PACK | Freq: Every day | ORAL | Status: AC

## 2024-01-21 MED ORDER — PROPOFOL 10 MG/ML IV BOLUS
INTRAVENOUS | Status: AC
  Filled 2024-01-21: qty 20

## 2024-01-21 MED ORDER — OXYCODONE HCL 5 MG PO TABS: 5.0000 mg | ORAL_TABLET | ORAL | 0 refills | Status: AC | PRN

## 2024-01-21 SURGICAL SUPPLY — 28 items
APPLICATOR COTTON TIP 6 STRL (MISCELLANEOUS) IMPLANT
APPLICATOR COTTON TIP 6IN STRL (MISCELLANEOUS) ×1 IMPLANT
DERMABOND ADVANCED .7 DNX12 (GAUZE/BANDAGES/DRESSINGS) ×1 IMPLANT
DRAPE SURG IRRIG POUCH 19X23 (DRAPES) ×1 IMPLANT
DRSG TEGADERM 2-3/8X2-3/4 SM (GAUZE/BANDAGES/DRESSINGS) IMPLANT
DRSG TELFA 3X8 NADH STRL (GAUZE/BANDAGES/DRESSINGS) IMPLANT
DURAPREP 26ML APPLICATOR (WOUND CARE) ×1 IMPLANT
GLOVE BIO SURGEON STRL SZ 6 (GLOVE) IMPLANT
GLOVE BIO SURGEON STRL SZ7 (GLOVE) ×1 IMPLANT
GLOVE BIOGEL PI IND STRL 6 (GLOVE) IMPLANT
GLOVE BIOGEL PI IND STRL 7.0 (GLOVE) ×3 IMPLANT
GLOVE BIOGEL PI IND STRL 7.5 (GLOVE) IMPLANT
GOWN STRL REUS W/ TWL LRG LVL3 (GOWN DISPOSABLE) ×2 IMPLANT
GOWN STRL REUS W/TWL LRG LVL3 (GOWN DISPOSABLE) IMPLANT
KIT PINK PAD W/HEAD ARE REST (MISCELLANEOUS) ×1 IMPLANT
KIT PINK PAD W/HEAD ARM REST (MISCELLANEOUS) ×1 IMPLANT
LIGASURE VESSEL 5MM BLUNT TIP (ELECTROSURGICAL) ×1 IMPLANT
PACK LAPAROSCOPY BASIN (CUSTOM PROCEDURE TRAY) ×1 IMPLANT
PROTECTOR NERVE ULNAR (MISCELLANEOUS) ×2 IMPLANT
SET TUBE SMOKE EVAC HIGH FLOW (TUBING) ×1 IMPLANT
SLEEVE XCEL OPT CAN 5 100 (ENDOMECHANICALS) ×1 IMPLANT
SUT MNCRL AB 4-0 PS2 18 (SUTURE) IMPLANT
SUT VICRYL 4-0 PS2 18IN ABS (SUTURE) ×1 IMPLANT
TOWEL GREEN STERILE FF (TOWEL DISPOSABLE) ×2 IMPLANT
TROCAR BALLN 12MMX100 BLUNT (TROCAR) ×1 IMPLANT
TROCAR XCEL NON-BLD 5MMX100MML (ENDOMECHANICALS) ×1 IMPLANT
WARMER LAPAROSCOPE (MISCELLANEOUS) ×1 IMPLANT
WATER STERILE IRR 1000ML POUR (IV SOLUTION) IMPLANT

## 2024-01-21 NOTE — Op Note (Signed)
 Operative Note  Procedure: Laparoscopic bilateral salpingectomy  Indications: Elective sterilization  Pre-operative Diagnosis: Desired elective sterilization   Post-operative Diagnosis: same  Surgeon: Edger House, MD  Assistants: Jenell Milliner, RNFA  Anesthesia: General endotracheal anesthesia  Findings: EUA: Normal external female genitalia.  Intra-op: Grossly normal abdominal survey including normal appearing bowel and liver edge. Normal appearing uterus and bilateral fallopian tubes. Small simple appearing cyst on right ovary, ~2cm, abutting right fallopian tube.   Estimated Blood Loss:  Minimal         Drains: Foley catheter, removed at end of case         Total IV Fluids:  per anesthesia  Procedure Details   The patient was seen in the pre-operative area. Risks, benefits, and alternatives of the procedure were reviewed and informed consent was signed and witnessed. Patient proceeded to operating room where general endotracheal anesthesia was obtained without complication. SCD boots were placed and turned on. Foley catheter was placed. The patient was prepped and draped in usual sterile manner.   Alis clamps were used to grasp the abdomen for upward traction. Veress needle was inserted at the umbilicus and gas attached with initial pressure <40mmHg. Abdomen was then insufflated to . 5mm trocar placed at umbilicus under direct visualization. Patient placed in trendelenburg and bowel swept out of the field. Findings as above.  Two additional 5mm trocars were placed under direct visualization in the right and left lower quadrants. The Ligasure was used to transect the right fallopian tube from the mesosalpinx and amputate at the cornua. The same was repeated for the left fallopian tube. Both tubes were removed through lateral trochar under direct visualization and sent to pathology. Hemostatic at end of case. All instruments and trocars were removed. Incisions were closed using  4-0 monocryl and dressed with Dermabond. 0.25% marcaine injected at all incision sites.    Foley catheter was removed.   Instrument, sponge, and needle counts were correct prior the abdominal closure and at the conclusion of the case.          Specimens: Bilateral fallopian tubes         Implants: None         Complications:  None; patient tolerated the procedure well.         Disposition: PACU - hemodynamically stable.         Condition: stable  Edger House, MD

## 2024-01-21 NOTE — Anesthesia Procedure Notes (Signed)
 Procedure Name: Intubation Date/Time: 01/21/2024 12:57 PM  Performed by: Yolonda Kida, CRNAPre-anesthesia Checklist: Patient identified, Emergency Drugs available, Suction available and Patient being monitored Patient Re-evaluated:Patient Re-evaluated prior to induction Oxygen Delivery Method: Circle System Utilized Preoxygenation: Pre-oxygenation with 100% oxygen Induction Type: IV induction Ventilation: Mask ventilation without difficulty Laryngoscope Size: 3 and Mac Grade View: Grade I Tube type: Oral Tube size: 7.0 mm Number of attempts: 1 Airway Equipment and Method: Stylet Placement Confirmation: ETT inserted through vocal cords under direct vision, positive ETCO2 and breath sounds checked- equal and bilateral Secured at: 22 cm Tube secured with: Tape Dental Injury: Teeth and Oropharynx as per pre-operative assessment

## 2024-01-21 NOTE — Interval H&P Note (Signed)
 History and Physical Interval Note:  01/21/2024 12:33 PM  Christine Mclaughlin  has presented today for surgery, with the diagnosis of Desires Sterilization.  The various methods of treatment have been discussed with the patient and family. After consideration of risks, benefits and other options for treatment, the patient has consented to  Procedure(s) with comments: LAPAROSCOPIC TUBAL LIGATION BY SALPINGECTOMY (Bilateral) [facility] as a surgical intervention.  The patient's history has been reviewed, patient examined, no change in status, stable for surgery.  I have reviewed the patient's chart and labs.  Questions were answered to the patient's satisfaction.     Antony Salmon D'iorio

## 2024-01-21 NOTE — H&P (Addendum)
 Surgical H&P  S: 37yo G1P1001 with no significant past medical history presenting for elective sterilization. Patient doing well without complaints.   Past Medical History:  Diagnosis Date   Medical history non-contributory    Past Surgical History:  Procedure Laterality Date   NO PAST SURGERIES     Current Outpatient Medications  Medication Instructions   acetaminophen (TYLENOL) 500-1,000 mg, Oral, Every 6 hours PRN   naphazoline-glycerin (CLEAR EYES REDNESS) 0.012-0.25 % SOLN 1-2 drops, 4 times daily PRN   norelgestromin-ethinyl estradiol Burr Medico) 150-35 MCG/24HR transdermal patch 1 patch, Transdermal, Weekly    Allergies  Allergen Reactions   Orange Fruit [Citrus] Swelling    Tongue lesions   O:  Today's Vitals   01/08/24 1539 01/21/24 1219  BP:  109/78  Pulse:  65  Resp:  16  Temp:  98 F (36.7 C)  TempSrc:  Oral  SpO2:  100%  Weight: 87.5 kg 87.5 kg  Height: 5\' 10"  (1.778 m) 5\' 10"  (1.778 m)  PainSc:  0-No pain   Body mass index is 27.69 kg/m.   PE:  GA: well appearing, NAD Chest: normal work of breathing on room air Abd: soft, ND Ext: no edema  Labs:  CBC (12/10/23): Hgb 12.9, Hct 38.9, Plt 279 CMP (12/10/23): Cr 0.98 Pap (12/10/23): NILM, HPV neg  A/P: 37yo G1P1001 with no significant past medical history presenting for elective sterilization via laparoscopic bilateral salpingectomy. Patient counseled on risks of surgery, including risk of regret and inability to reverse procedure. Patient advised she will no longer be able to spontaneously conceive and would require extensive medical intervention to have another pregnancy (e.g. IVF). Also reviewed alternative birth control options with patient including LARCs. Patient re-affirmed that she desires permanent sterilization. Risks of surgery including but not limited to pain, bleeding, infection, and damage to surrounding organs reviewed. Informed consent signed and witnessed. Plan to proceed to OR when ready.    Marlene Bast, MD

## 2024-01-21 NOTE — Anesthesia Preprocedure Evaluation (Signed)
 Anesthesia Evaluation  Patient identified by MRN, date of birth, ID band Patient awake    Reviewed: Allergy & Precautions, NPO status , Patient's Chart, lab work & pertinent test results  Airway Mallampati: I  TM Distance: >3 FB Neck ROM: Full    Dental no notable dental hx. (+) Teeth Intact, Dental Advisory Given   Pulmonary former smoker   Pulmonary exam normal breath sounds clear to auscultation       Cardiovascular negative cardio ROS Normal cardiovascular exam Rhythm:Regular Rate:Normal     Neuro/Psych negative neurological ROS  negative psych ROS   GI/Hepatic negative GI ROS, Neg liver ROS,,,  Endo/Other  negative endocrine ROS    Renal/GU negative Renal ROS  Female GU complaint     Musculoskeletal negative musculoskeletal ROS (+)    Abdominal   Peds  Hematology negative hematology ROS (+)   Anesthesia Other Findings   Reproductive/Obstetrics                             Anesthesia Physical Anesthesia Plan  ASA: 1  Anesthesia Plan: General   Post-op Pain Management: Tylenol PO (pre-op)* and Celebrex PO (pre-op)*   Induction: Intravenous  PONV Risk Score and Plan: 3 and Ondansetron, Dexamethasone, Midazolam and Treatment may vary due to age or medical condition  Airway Management Planned: Mask and Oral ETT  Additional Equipment: None  Intra-op Plan:   Post-operative Plan: Extubation in OR  Informed Consent:   Plan Discussed with:   Anesthesia Plan Comments:        Anesthesia Quick Evaluation

## 2024-01-21 NOTE — Discharge Instructions (Addendum)
 Post-Operative Discharge Instructions  Surgery: Bilateral salpingectomy/elective sterilization  Surgeon: Marlene Bast, MD  Please follow the instructions below during your recovery period. If you have questions or concerns please call our office at [phone].  Please look out for the following and call the office immediately or go to nearest Emergency Room if you experience: - Chest pain - Shortness of breath - Fever >100.4 F  - Persistent nausea and/or vomiting - Difficulty urinating or not urinating for >6 hours  - Severe abdominal or pelvic pain not responsive to recommended pain medications - Heavy vaginal bleeding (soaking >2 pads per hour)  - Pain, swelling, and/or redness in one leg  - Redness or swelling at incision site   Activity: You may resume normal activity as tolerated. We recommend walking as soon and as much as you are comfortably able.   Expectations: - Light vaginal bleeding is normal after your procedure and should resolve in the next few days. - Some abdominal soreness and bloating is normal. Please see below for recommended medications.  - Feeling more tired than usual for a few days following anesthesia is normal.   Diet: You may resume normal diet immediately after surgery. Anesthesia can make you feel nauseous, we recommend avoiding spicy or acidic foods for the first 72 hours after surgery. Drink lots of water (64oz/day).   Pain: We expect you to have some abdominal/pelvic pain and cramping for a few days after your surgery. We recommend taking medication around the clock for the first 48-72 hours after your surgery as follows: - Acetaminophen (Tylenol) 1000mg  every 6 hours  - Ibuprofen (Advil or Motrin) 600mg  every 6 hours - Oxycodone 5mg  every 4 hours as needed for breakthrough pain  - Miralax or other stool softener to keep bowel movements soft and avoid straining   We wish you a speedy recovery!           Post Anesthesia Home Care  Instructions  Activity: Get plenty of rest for the remainder of the day. A responsible individual must stay with you for 24 hours following the procedure.  For the next 24 hours, DO NOT: -Drive a car -Advertising copywriter -Drink alcoholic beverages -Take any medication unless instructed by your physician -Make any legal decisions or sign important papers.  Meals: Start with liquid foods such as gelatin or soup. Progress to regular foods as tolerated. Avoid greasy, spicy, heavy foods. If nausea and/or vomiting occur, drink only clear liquids until the nausea and/or vomiting subsides. Call your physician if vomiting continues.  Special Instructions/Symptoms: Your throat may feel dry or sore from the anesthesia or the breathing tube placed in your throat during surgery. If this causes discomfort, gargle with warm salt water. The discomfort should disappear within 24 hours.

## 2024-01-21 NOTE — Transfer of Care (Signed)
 Immediate Anesthesia Transfer of Care Note  Patient: Christine Mclaughlin  Procedure(s) Performed: LAPAROSCOPIC TUBAL LIGATION BY SALPINGECTOMY (Bilateral)  Patient Location: PACU  Anesthesia Type:General  Level of Consciousness: awake, alert , oriented, and patient cooperative  Airway & Oxygen Therapy: Patient Spontanous Breathing  Post-op Assessment: Report given to RN and Post -op Vital signs reviewed and stable  Post vital signs: Reviewed and stable  Last Vitals:  Vitals Value Taken Time  BP 132/77 01/21/24 1349  Temp 36.4 C 01/21/24 1349  Pulse 72 01/21/24 1353  Resp 15 01/21/24 1354  SpO2 100 % 01/21/24 1353  Vitals shown include unfiled device data.  Last Pain:  Vitals:   01/21/24 1219  TempSrc: Oral  PainSc: 0-No pain      Patients Stated Pain Goal: 6 (01/21/24 1219)  Complications: No notable events documented.

## 2024-01-22 ENCOUNTER — Encounter (HOSPITAL_COMMUNITY): Payer: Self-pay | Admitting: Obstetrics

## 2024-01-22 MED ORDER — PROPOFOL 1000 MG/100ML IV EMUL
INTRAVENOUS | Status: AC
  Filled 2024-01-22: qty 200

## 2024-01-22 NOTE — Anesthesia Postprocedure Evaluation (Signed)
 Anesthesia Post Note  Patient: Christine Mclaughlin  Procedure(s) Performed: LAPAROSCOPIC TUBAL LIGATION BY SALPINGECTOMY (Bilateral)     Patient location during evaluation: PACU Anesthesia Type: General Level of consciousness: awake and alert Pain management: pain level controlled Vital Signs Assessment: post-procedure vital signs reviewed and stable Respiratory status: spontaneous breathing, nonlabored ventilation, respiratory function stable and patient connected to nasal cannula oxygen Cardiovascular status: blood pressure returned to baseline and stable Postop Assessment: no apparent nausea or vomiting Anesthetic complications: no  No notable events documented.  Last Vitals:  Vitals:   01/21/24 1445 01/21/24 1500  BP: 124/72 (!) 144/82  Pulse: 66 71  Resp: 12 13  Temp: 36.5 C 36.6 C  SpO2: 96% 98%    Last Pain:  Vitals:   01/21/24 1500  TempSrc:   PainSc: 4                  Chyna Kneece L [place]

## 2024-01-23 LAB — SURGICAL PATHOLOGY

## 2024-02-05 ENCOUNTER — Other Ambulatory Visit: Payer: Self-pay | Admitting: Obstetrics

## 2024-02-05 DIAGNOSIS — N6320 Unspecified lump in the left breast, unspecified quadrant: Secondary | ICD-10-CM

## 2024-02-25 ENCOUNTER — Encounter

## 2024-02-25 ENCOUNTER — Other Ambulatory Visit

## 2024-03-05 ENCOUNTER — Other Ambulatory Visit

## 2024-03-05 ENCOUNTER — Inpatient Hospital Stay: Admission: RE | Admit: 2024-03-05 | Source: Ambulatory Visit

## 2024-09-29 ENCOUNTER — Ambulatory Visit: Admitting: Family Medicine

## 2024-09-29 ENCOUNTER — Encounter: Payer: Self-pay | Admitting: Family Medicine

## 2024-09-29 VITALS — BP 116/77 | HR 60 | Temp 98.6°F | Resp 18 | Ht 70.0 in | Wt 181.0 lb

## 2024-09-29 DIAGNOSIS — K219 Gastro-esophageal reflux disease without esophagitis: Secondary | ICD-10-CM | POA: Insufficient documentation

## 2024-09-29 MED ORDER — SUCRALFATE 1 G PO TABS: 1.0000 g | ORAL_TABLET | Freq: Four times a day (QID) | ORAL | 0 refills | Status: DC

## 2024-09-29 MED ORDER — PANTOPRAZOLE SODIUM 40 MG PO TBEC: 40.0000 mg | DELAYED_RELEASE_TABLET | Freq: Two times a day (BID) | ORAL | 3 refills | Status: AC

## 2024-09-29 NOTE — Progress Notes (Signed)
 New Patient Office Visit  Subjective    Patient ID: Christine Mclaughlin, female    DOB: 1986-07-24  Age: 38 y.o. MRN: [id number]  CC:  Chief Complaint  Patient presents with   Establish Care   Gastroesophageal Reflux    HPI Christine Mclaughlin presents to establish care Discussed the use of AI scribe software for clinical note transcription with the patient, who gave verbal consent to proceed.  History of Present Illness   Christine Mclaughlin is a 38 year old female with GERD who presents with worsening symptoms and difficulty swallowing.  She has a history of gastroesophageal reflux disease (GERD) and is experiencing worsening symptoms, including a sensation of fullness in her throat and difficulty swallowing, with pain radiating to her chest described as unbearable.  She denies feelings of pain radiating into her back.  These symptoms have significantly impacted her ability to eat, leading to a diet primarily consisting of lettuce over the past two weeks.  She does not feel that food gets stuck in her throat.  She has tried multiple treatments for GERD, including Prilosec, Alka-Seltzer, baking soda, and apple cider vinegar, but reports no relief. She has also attempted to identify and avoid trigger foods, noting that tomatoes and pizza exacerbate her symptoms. She drinks only water , avoiding sodas, juice, and alcohol.  Her symptoms have led to a weight loss of approximately 23 pounds, dropping from 203 pounds to 180 pounds. She fears eating due to the pain it causes.  She has a family history of GERD, as her father also suffers from the condition. She has previously taken medication prescribed to her father but found it ineffective.  She denies smoking, having quit three years ago, and does not use drugs. 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        Outpatient Encounter Medications as of 09/29/2024  Medication Sig   pantoprazole (PROTONIX) 40 MG tablet  Take 1 tablet (40 mg total) by mouth 2 (two) times daily.   sucralfate (CARAFATE) 1 g tablet Take 1 tablet (1 g total) by mouth 4 (four) times daily.   naphazoline-glycerin (CLEAR EYES REDNESS) 0.012-0.25 % SOLN 1-2 drops 4 (four) times daily as needed for eye irritation.   oxyCODONE  (ROXICODONE ) 5 MG immediate release tablet Take 1 tablet (5 mg total) by mouth every 4 (four) hours as needed for breakthrough pain.   polyethylene glycol (MIRALAX ) 17 g packet Take 17 g by mouth daily.   No facility-administered encounter medications on file as of 09/29/2024.    Past Medical History:  Diagnosis Date   Allergy    Citrus   GERD (gastroesophageal reflux disease)    GERD   Medical history non-contributory     Past Surgical History:  Procedure Laterality Date   LAPAROSCOPIC TUBAL LIGATION Bilateral 01/21/2024   Procedure: LAPAROSCOPIC TUBAL LIGATION BY SALPINGECTOMY;  Surgeon: D'Iorio, Hadassah LABOR, MD;  Location: [facility] OR;  Service: Gynecology;  Laterality: Bilateral;  [facility]   NO PAST SURGERIES     TUBAL LIGATION  March 11,2025   Tubes removed    Family History  Problem Relation Age of Onset   Diabetes Maternal Grandmother    Hypertension Maternal Grandmother    Diabetes Paternal Grandfather    Hypertension Paternal Grandfather     Social History   Socioeconomic History   Marital status: Single    Spouse name: Not on file   Number of children: Not on file   Years  of education: Not on file   Highest education level: Associate degree: occupational, technical, or vocational program  Occupational History   Not on file  Tobacco Use   Smoking status: Former    Average packs/day: 0.5 packs/day for 10.0 years (5.0 ttl pk-yrs)    Types: Cigarettes    Start date: 2012    Passive exposure: Past   Smokeless tobacco: Never  Substance and Sexual Activity   Alcohol use: Not Currently    Comment: rare   Drug use: No   Sexual activity: Yes    Birth control/protection: Surgical  Other Topics  Concern   Not on file  Social History Narrative   Not on file   Social Drivers of Health   Financial Resource Strain: Low Risk  (09/22/2024)   Overall Financial Resource Strain (CARDIA)    Difficulty of Paying Living Expenses: Not hard at all  Food Insecurity: No Food Insecurity (09/22/2024)   Hunger Vital Sign    Worried About Running Out of Food in the Last Year: Never true    Ran Out of Food in the Last Year: Never true  Transportation Needs: No Transportation Needs (09/22/2024)   PRAPARE - Administrator, Civil Service (Medical): No    Lack of Transportation (Non-Medical): No  Physical Activity: Insufficiently Active (09/22/2024)   Exercise Vital Sign    Days of Exercise per Week: 7 days    Minutes of Exercise per Session: 10 min  Stress: No Stress Concern Present (09/22/2024)   [employer] of Occupational Health - Occupational Stress Questionnaire    Feeling of Stress: Not at all  Social Connections: Unknown (09/22/2024)   Social Connection and Isolation Panel    Frequency of Communication with Friends and Family: Three times a week    Frequency of Social Gatherings with Friends and Family: Once a week    Attends Religious Services: More than 4 times per year    Active Member of [employer] or Organizations: No    Attends Engineer, Structural: Not on file    Marital Status: Patient declined  Intimate Partner Violence: Not on file          Objective   BP 116/77 (BP Location: Left Arm, Patient Position: Sitting, Cuff Size: Normal)   Pulse 60   Temp 98.6 F (37 C) (Oral)   Resp 18   Ht 5' 10 (1.778 m)   Wt 181 lb (82.1 kg)   LMP 09/09/2024 (Approximate)   SpO2 97%   BMI 25.97 kg/m    Physical Exam Vitals and nursing note reviewed.  Constitutional:      Appearance: Normal appearance.  HENT:     Head: Normocephalic and atraumatic.  Eyes:     Conjunctiva/sclera: Conjunctivae normal.  Cardiovascular:     Rate and Rhythm: Normal rate  and regular rhythm.  Pulmonary:     Effort: Pulmonary effort is normal.     Breath sounds: Normal breath sounds.  Abdominal:     General: Abdomen is flat. Bowel sounds are normal.     Palpations: Abdomen is soft.     Tenderness: There is no abdominal tenderness.  Musculoskeletal:     Right lower leg: No edema.     Left lower leg: No edema.  Skin:    General: Skin is warm and dry.  Neurological:     Mental Status: She is alert and oriented to person, place, and time.  Psychiatric:        Mood and  Affect: Mood normal.        Behavior: Behavior normal.        Thought Content: Thought content normal.        Judgment: Judgment normal.            The ASCVD Risk score (Arnett DK, et al., 2019) failed to calculate for the following reasons:   The 2019 ASCVD risk score is only valid for ages 51 to 21     Assessment & Plan:  Gastroesophageal reflux disease, unspecified whether esophagitis present Assessment & Plan: Sending her patient education on foods that she can tolerate.  Trial of sucralfate 4 times a day and pantoprazole 40 mg twice a day.  Follow-up in a month.  If she does not have marked improvement we will ask her to see GI for an EGD.  Orders: -     Sucralfate; Take 1 tablet (1 g total) by mouth 4 (four) times daily.  Dispense: 120 tablet; Refill: 0  Other orders -     Pantoprazole Sodium; Take 1 tablet (40 mg total) by mouth 2 (two) times daily.  Dispense: 60 tablet; Refill: 3    Return in about 4 weeks (around 10/27/2024).   Carletta Feasel K Ronnika Collett, MD

## 2024-09-29 NOTE — Assessment & Plan Note (Signed)
 Sending her patient education on foods that she can tolerate.  Trial of sucralfate 4 times a day and pantoprazole 40 mg twice a day.  Follow-up in a month.  If she does not have marked improvement we will ask her to see GI for an EGD.

## 2024-10-29 ENCOUNTER — Encounter: Payer: Self-pay | Admitting: Family Medicine

## 2024-10-29 ENCOUNTER — Ambulatory Visit: Admitting: Family Medicine

## 2024-10-29 VITALS — BP 94/56 | HR 67 | Ht 70.0 in | Wt 184.0 lb

## 2024-10-29 DIAGNOSIS — K219 Gastro-esophageal reflux disease without esophagitis: Secondary | ICD-10-CM

## 2024-10-29 NOTE — Progress Notes (Signed)
 Established Patient Office Visit  Subjective   Patient ID: Christine Mclaughlin, female    DOB: Mar 17, 1986  Age: 38 y.o. MRN: [id number]  Chief Complaint  Patient presents with   Follow-up    54m f/u GERD Would like to talk about CARAFATE  medication she is taking   Pt expresses feeling a little off this morning - is not fasting    HPI Discussed the use of AI scribe software for clinical note transcription with the patient, who gave verbal consent to proceed.  History of Present Illness   Christine Mclaughlin is a 38 year old female who presents with difficulty managing her Carafate  regimen for stomach protection.  She experiences difficulty managing her Carafate  regimen, which she takes four times a day to coat and protect her stomach. She finds it challenging to take the medication an hour before eating or two to three hours after eating, and to fit all four doses into her day. Despite these challenges, she notices an improvement in her symptoms when she adheres to the regimen and can tell when she misses a dose.  She is also taking pantoprazole , a smaller pill, twice a day, which she finds manageable.  She believes her weight has remained stable.   Objective:     BP (!) 94/56 (BP Location: Left Arm, Patient Position: Sitting, Cuff Size: Normal)   Pulse 67   Ht 5' 10 (1.778 m)   Wt 184 lb (83.5 kg)   LMP 09/09/2024 (Approximate)   SpO2 98%   BMI 26.40 kg/m    Physical Exam Vitals and nursing note reviewed.  Constitutional:      Appearance: Normal appearance.  HENT:     Head: Normocephalic and atraumatic.  Eyes:     Conjunctiva/sclera: Conjunctivae normal.  Cardiovascular:     Rate and Rhythm: Normal rate and regular rhythm.  Pulmonary:     Effort: Pulmonary effort is normal.     Breath sounds: Normal breath sounds.  Abdominal:     General: Abdomen is flat. Bowel sounds are normal.     Palpations: Abdomen is soft.     Tenderness: There is no abdominal tenderness.   Musculoskeletal:     Right lower leg: No edema.     Left lower leg: No edema.  Skin:    General: Skin is warm and dry.  Neurological:     Mental Status: She is alert and oriented to person, place, and time.  Psychiatric:        Mood and Affect: Mood normal.        Behavior: Behavior normal.        Thought Content: Thought content normal.        Judgment: Judgment normal.          No results found for any visits on 10/29/24.    The ASCVD Risk score (Arnett DK, et al., 2019) failed to calculate for the following reasons:   The 2019 ASCVD risk score is only valid for ages 24 to 5   * - Cholesterol units were assumed    Assessment & Plan:  Gastroesophageal reflux disease, unspecified whether esophagitis present Assessment & Plan: She is feeling better.  She is on Carafate  1 g 1 hour AC nightly and Protonix  40 mg twice daily.  It is difficult for her to schedule her Carafate  throughout the day.  Orders: -     Ambulatory referral to Gastroenterology     No follow-ups on file.    Devere  K Earley Grobe, MD

## 2024-10-29 NOTE — Assessment & Plan Note (Signed)
 She is feeling better.  She is on Carafate  1 g 1 hour AC nightly and Protonix  40 mg twice daily.  It is difficult for her to schedule her Carafate  throughout the day.

## 2024-11-17 ENCOUNTER — Telehealth: Payer: Self-pay | Admitting: Family Medicine

## 2024-11-17 NOTE — Telephone Encounter (Signed)
 Routing to Dr. Ziglar for review.

## 2024-11-17 NOTE — Telephone Encounter (Signed)
 Copied from CRM [id number]. Topic: Clinical - Medication Question >> Nov 17, 2024  3:11 PM Delon T wrote: Reason for CRM: sucralfate  (CARAFATE ) 1 g tablet - out of refills, asking if she needs to stop taking or get new prescription- [phone]

## 2024-11-19 ENCOUNTER — Ambulatory Visit: Payer: Self-pay | Admitting: Family Medicine

## 2024-11-19 NOTE — Telephone Encounter (Signed)
 FYI Only or Action Required?: Action required by provider: medication refill request.  Patient was last seen in primary care on 10/29/2024 by Ziglar, Susan K, MD.  Called Nurse Triage reporting Chest Pain.  Symptoms began several days ago.  Interventions attempted: Prescription medications: protonix .  Symptoms are: unchanged.  Triage Disposition: Go to ED Now (Notify PCP)  Patient/caregiver understands and will follow disposition?: Yes  Copied from CRM [id number]. Topic: Clinical - Red Word Triage >> Nov 19, 2024  4:59 PM Zebedee SAUNDERS wrote: Red Word that prompted transfer to Nurse Triage: Pt has tightness of chest when breathing in, started 3 days ago. Reason for Disposition  Pain also in shoulder(s) or arm(s) or jaw  (Exception: Pain is clearly made worse by movement.)  Answer Assessment - Initial Assessment Questions Sucralfate  1gm 4x Daily. Says stomach is better but now chest has been hurting X 3 days. no acidic taste. Patient doesn't feel like anything she ate should have caused this. Pain worse with deep breath. Patient Does not want to go to ER. [facility] 1. LOCATION: Where does it hurt?       Chest 2. RADIATION: Does the pain go anywhere else? (e.g., into neck, jaw, arms, back)     Shoulder 3. ONSET: When did the chest pain begin? (Minutes, hours or days)      A few days 4. PATTERN: Does the pain come and go, or has it been constant since it started?  Does it get worse with exertion?      Consistent 5. DURATION: How long does it last (e.g., seconds, minutes, hours)    Stays  Protocols used: Chest Pain-A-AH

## 2024-11-20 ENCOUNTER — Telehealth: Payer: Self-pay | Admitting: Family Medicine

## 2024-11-20 ENCOUNTER — Other Ambulatory Visit: Payer: Self-pay | Admitting: Family Medicine

## 2024-11-20 DIAGNOSIS — K219 Gastro-esophageal reflux disease without esophagitis: Secondary | ICD-10-CM

## 2024-11-20 NOTE — Telephone Encounter (Signed)
 She stopped taking the sucralfate  but kept taking the pantoprazole  40mg  BID.  She developed chest pains after eating oatmeal.  It was a constant pain and felt like it does when she eats a trigger food.  She was told to go to the ER and get checked out but she refused. She feels better today.  She has an appointment with Dr. Farris in February.  Please restart the sucralfate  1 gram 4 times a day.  Call me back in a couple of days and let me know how you are. She agreed.

## 2024-11-23 ENCOUNTER — Other Ambulatory Visit: Payer: Self-pay | Admitting: Family Medicine

## 2024-11-23 DIAGNOSIS — K219 Gastro-esophageal reflux disease without esophagitis: Secondary | ICD-10-CM

## 2024-11-23 MED ORDER — SUCRALFATE 1 G PO TABS: 1.0000 g | ORAL_TABLET | Freq: Four times a day (QID) | ORAL | 0 refills | Status: AC

## 2024-11-23 NOTE — Telephone Encounter (Signed)
 She needs to be seen and get evaluated for her chest pain.  Please get her on my schedule soon.

## 2024-12-31 ENCOUNTER — Ambulatory Visit: Admitting: Gastroenterology
